# Patient Record
Sex: Male | Born: 1955 | Race: White | Hispanic: No | Smoking: Former smoker
Health system: Southern US, Community
[De-identification: ages and names within clinical notes are randomized; demographics above are authoritative.]

## PROBLEM LIST (undated history)

## (undated) DIAGNOSIS — I2699 Other pulmonary embolism without acute cor pulmonale: Secondary | ICD-10-CM

## (undated) DIAGNOSIS — L539 Erythematous condition, unspecified: Secondary | ICD-10-CM

## (undated) DIAGNOSIS — I2510 Atherosclerotic heart disease of native coronary artery without angina pectoris: Secondary | ICD-10-CM

## (undated) DIAGNOSIS — J4530 Mild persistent asthma, uncomplicated: Secondary | ICD-10-CM

## (undated) DIAGNOSIS — I10 Essential (primary) hypertension: Secondary | ICD-10-CM

## (undated) DIAGNOSIS — I872 Venous insufficiency (chronic) (peripheral): Secondary | ICD-10-CM

## (undated) DIAGNOSIS — L0390 Cellulitis, unspecified: Secondary | ICD-10-CM

## (undated) DIAGNOSIS — E790 Hyperuricemia without signs of inflammatory arthritis and tophaceous disease: Secondary | ICD-10-CM

## (undated) DIAGNOSIS — F320 Major depressive disorder, single episode, mild: Secondary | ICD-10-CM

## (undated) DIAGNOSIS — M109 Gout, unspecified: Secondary | ICD-10-CM

## (undated) DIAGNOSIS — M1990 Unspecified osteoarthritis, unspecified site: Secondary | ICD-10-CM

## (undated) DIAGNOSIS — J45901 Unspecified asthma with (acute) exacerbation: Secondary | ICD-10-CM

## (undated) DIAGNOSIS — M6740 Ganglion, unspecified site: Secondary | ICD-10-CM

## (undated) DIAGNOSIS — R7303 Prediabetes: Secondary | ICD-10-CM

## (undated) DIAGNOSIS — J329 Chronic sinusitis, unspecified: Secondary | ICD-10-CM

## (undated) DIAGNOSIS — M7989 Other specified soft tissue disorders: Secondary | ICD-10-CM

## (undated) DIAGNOSIS — F32A Depression, unspecified: Secondary | ICD-10-CM

## (undated) HISTORY — DX: Unspecified osteoarthritis, unspecified site: M19.90

## (undated) HISTORY — DX: Essential (primary) hypertension: I10

## (undated) HISTORY — DX: Other specified soft tissue disorders: M79.89

## (undated) HISTORY — DX: Atherosclerotic heart disease of native coronary artery without angina pectoris: I25.10

## (undated) HISTORY — DX: Prediabetes: R73.03

## (undated) HISTORY — DX: Mild persistent asthma, uncomplicated: J45.30

## (undated) HISTORY — DX: Unspecified asthma with (acute) exacerbation: J45.901

## (undated) HISTORY — DX: Chronic sinusitis, unspecified: J32.9

## (undated) HISTORY — DX: Other pulmonary embolism without acute cor pulmonale: I26.99

## (undated) HISTORY — DX: Morbid (severe) obesity due to excess calories: E66.01

## (undated) HISTORY — DX: Erythematous condition, unspecified: L53.9

## (undated) HISTORY — DX: Depression, unspecified: F32.A

## (undated) HISTORY — DX: Cellulitis, unspecified: L03.90

## (undated) HISTORY — PX: OTHER SURGICAL HISTORY: SHX169

## (undated) HISTORY — DX: Hyperuricemia without signs of inflammatory arthritis and tophaceous disease: E79.0

## (undated) HISTORY — DX: Venous insufficiency (chronic) (peripheral): I87.2

## (undated) HISTORY — DX: Ganglion, unspecified site: M67.40

## (undated) HISTORY — DX: Gout, unspecified: M10.9

## (undated) HISTORY — DX: Major depressive disorder, single episode, mild: F32.0

---

## 2006-07-06 ENCOUNTER — Encounter: Admission: RE | Admit: 2006-07-06 | Discharge: 2006-07-06 | Payer: Self-pay | Admitting: Otolaryngology

## 2008-05-04 HISTORY — PX: COLONOSCOPY: SHX174

## 2015-07-16 DIAGNOSIS — Z125 Encounter for screening for malignant neoplasm of prostate: Secondary | ICD-10-CM | POA: Diagnosis not present

## 2015-07-16 DIAGNOSIS — Z0000 Encounter for general adult medical examination without abnormal findings: Secondary | ICD-10-CM | POA: Diagnosis not present

## 2015-07-16 DIAGNOSIS — Z13220 Encounter for screening for lipoid disorders: Secondary | ICD-10-CM | POA: Diagnosis not present

## 2015-07-16 DIAGNOSIS — E790 Hyperuricemia without signs of inflammatory arthritis and tophaceous disease: Secondary | ICD-10-CM | POA: Diagnosis not present

## 2015-07-16 DIAGNOSIS — Z23 Encounter for immunization: Secondary | ICD-10-CM | POA: Diagnosis not present

## 2016-03-17 DIAGNOSIS — J329 Chronic sinusitis, unspecified: Secondary | ICD-10-CM | POA: Diagnosis not present

## 2016-04-26 ENCOUNTER — Ambulatory Visit: Payer: Self-pay | Admitting: Allergy & Immunology

## 2016-05-05 DIAGNOSIS — R0981 Nasal congestion: Secondary | ICD-10-CM | POA: Diagnosis not present

## 2016-05-22 ENCOUNTER — Ambulatory Visit: Payer: Self-pay | Admitting: Allergy and Immunology

## 2016-05-22 DIAGNOSIS — J301 Allergic rhinitis due to pollen: Secondary | ICD-10-CM | POA: Diagnosis not present

## 2016-05-22 DIAGNOSIS — J3089 Other allergic rhinitis: Secondary | ICD-10-CM | POA: Diagnosis not present

## 2017-05-18 DIAGNOSIS — J4531 Mild persistent asthma with (acute) exacerbation: Secondary | ICD-10-CM | POA: Diagnosis not present

## 2017-05-18 DIAGNOSIS — J301 Allergic rhinitis due to pollen: Secondary | ICD-10-CM | POA: Diagnosis not present

## 2017-06-05 DIAGNOSIS — J4531 Mild persistent asthma with (acute) exacerbation: Secondary | ICD-10-CM | POA: Diagnosis not present

## 2017-06-05 DIAGNOSIS — J301 Allergic rhinitis due to pollen: Secondary | ICD-10-CM | POA: Diagnosis not present

## 2017-08-24 DIAGNOSIS — J320 Chronic maxillary sinusitis: Secondary | ICD-10-CM | POA: Diagnosis not present

## 2017-08-31 ENCOUNTER — Other Ambulatory Visit: Payer: Self-pay | Admitting: Family Medicine

## 2017-08-31 DIAGNOSIS — J320 Chronic maxillary sinusitis: Secondary | ICD-10-CM

## 2017-09-25 ENCOUNTER — Other Ambulatory Visit: Payer: Self-pay

## 2017-10-01 ENCOUNTER — Other Ambulatory Visit: Payer: Self-pay

## 2017-10-10 ENCOUNTER — Other Ambulatory Visit: Payer: Self-pay

## 2017-10-23 ENCOUNTER — Other Ambulatory Visit: Payer: Self-pay

## 2017-11-06 ENCOUNTER — Other Ambulatory Visit: Payer: Self-pay

## 2017-11-20 ENCOUNTER — Other Ambulatory Visit: Payer: Self-pay

## 2017-11-21 ENCOUNTER — Other Ambulatory Visit: Payer: Self-pay

## 2018-02-08 DIAGNOSIS — R509 Fever, unspecified: Secondary | ICD-10-CM | POA: Diagnosis not present

## 2018-02-08 DIAGNOSIS — J320 Chronic maxillary sinusitis: Secondary | ICD-10-CM | POA: Diagnosis not present

## 2018-02-13 ENCOUNTER — Other Ambulatory Visit: Payer: Self-pay

## 2018-06-21 DIAGNOSIS — J4531 Mild persistent asthma with (acute) exacerbation: Secondary | ICD-10-CM | POA: Diagnosis not present

## 2018-06-21 DIAGNOSIS — J302 Other seasonal allergic rhinitis: Secondary | ICD-10-CM | POA: Diagnosis not present

## 2018-07-04 DIAGNOSIS — M109 Gout, unspecified: Secondary | ICD-10-CM | POA: Diagnosis not present

## 2018-07-16 DIAGNOSIS — M109 Gout, unspecified: Secondary | ICD-10-CM | POA: Diagnosis not present

## 2018-07-17 DIAGNOSIS — M109 Gout, unspecified: Secondary | ICD-10-CM | POA: Diagnosis not present

## 2018-12-23 DIAGNOSIS — J302 Other seasonal allergic rhinitis: Secondary | ICD-10-CM | POA: Diagnosis not present

## 2019-05-04 ENCOUNTER — Ambulatory Visit: Payer: Self-pay

## 2019-05-11 ENCOUNTER — Ambulatory Visit: Payer: Self-pay

## 2019-05-31 ENCOUNTER — Ambulatory Visit: Payer: Self-pay | Attending: Internal Medicine

## 2019-06-05 ENCOUNTER — Ambulatory Visit: Payer: Self-pay

## 2019-06-05 DIAGNOSIS — J019 Acute sinusitis, unspecified: Secondary | ICD-10-CM | POA: Diagnosis not present

## 2019-06-05 DIAGNOSIS — J302 Other seasonal allergic rhinitis: Secondary | ICD-10-CM | POA: Diagnosis not present

## 2019-06-09 DIAGNOSIS — R1010 Upper abdominal pain, unspecified: Secondary | ICD-10-CM | POA: Diagnosis not present

## 2019-06-09 DIAGNOSIS — R739 Hyperglycemia, unspecified: Secondary | ICD-10-CM | POA: Diagnosis not present

## 2019-06-09 DIAGNOSIS — R140 Abdominal distension (gaseous): Secondary | ICD-10-CM | POA: Diagnosis not present

## 2019-06-09 DIAGNOSIS — R339 Retention of urine, unspecified: Secondary | ICD-10-CM | POA: Diagnosis not present

## 2019-06-10 DIAGNOSIS — R339 Retention of urine, unspecified: Secondary | ICD-10-CM | POA: Diagnosis not present

## 2019-06-12 ENCOUNTER — Ambulatory Visit: Payer: Self-pay

## 2019-06-12 ENCOUNTER — Other Ambulatory Visit (HOSPITAL_BASED_OUTPATIENT_CLINIC_OR_DEPARTMENT_OTHER): Payer: Self-pay | Admitting: Family Medicine

## 2019-06-12 DIAGNOSIS — R140 Abdominal distension (gaseous): Secondary | ICD-10-CM

## 2019-06-12 DIAGNOSIS — R1010 Upper abdominal pain, unspecified: Secondary | ICD-10-CM

## 2019-06-12 DIAGNOSIS — R339 Retention of urine, unspecified: Secondary | ICD-10-CM

## 2019-06-17 ENCOUNTER — Ambulatory Visit: Payer: Federal, State, Local not specified - PPO | Attending: Internal Medicine

## 2019-06-17 DIAGNOSIS — Z23 Encounter for immunization: Secondary | ICD-10-CM

## 2019-06-17 NOTE — Progress Notes (Signed)
   Covid-19 Vaccination Clinic  Name:  DAXTON NYDAM    MRN: [id number] DOB: 1955/06/07  06/17/2019  Mr. Wrede was observed post Covid-19 immunization for 30 minutes based on pre-vaccination screening without incident. He was provided with Vaccine Information Sheet and instruction to access the V-Safe system.   Mr. Roedl was instructed to call 911 with any severe reactions post vaccine: [name] Kitchen Difficulty breathing  . Swelling of face and throat  . A fast heartbeat  . A bad rash all over body  . Dizziness and weakness   Immunizations Administered    Name Date Dose VIS Date Route   Pfizer COVID-19 Vaccine 06/17/2019 10:02 AM 0.3 mL 02/14/2019 Intramuscular   Manufacturer: [employer], [employer]   Lot: [id number]   NDC: [address]-3

## 2019-06-18 ENCOUNTER — Ambulatory Visit (HOSPITAL_BASED_OUTPATIENT_CLINIC_OR_DEPARTMENT_OTHER): Payer: Federal, State, Local not specified - PPO

## 2019-06-25 ENCOUNTER — Ambulatory Visit (HOSPITAL_BASED_OUTPATIENT_CLINIC_OR_DEPARTMENT_OTHER): Admission: RE | Admit: 2019-06-25 | Payer: Federal, State, Local not specified - PPO | Source: Ambulatory Visit

## 2019-07-08 ENCOUNTER — Ambulatory Visit: Payer: Federal, State, Local not specified - PPO

## 2019-07-15 ENCOUNTER — Ambulatory Visit: Payer: Federal, State, Local not specified - PPO

## 2019-08-05 ENCOUNTER — Ambulatory Visit: Payer: Federal, State, Local not specified - PPO

## 2019-08-14 ENCOUNTER — Ambulatory Visit: Payer: Federal, State, Local not specified - PPO

## 2019-08-21 ENCOUNTER — Ambulatory Visit: Payer: Federal, State, Local not specified - PPO

## 2019-10-14 ENCOUNTER — Ambulatory Visit: Payer: Self-pay

## 2020-04-01 DIAGNOSIS — R0989 Other specified symptoms and signs involving the circulatory and respiratory systems: Secondary | ICD-10-CM | POA: Diagnosis not present

## 2020-04-01 DIAGNOSIS — Z20822 Contact with and (suspected) exposure to covid-19: Secondary | ICD-10-CM | POA: Diagnosis not present

## 2020-04-01 DIAGNOSIS — U071 COVID-19: Secondary | ICD-10-CM | POA: Diagnosis not present

## 2020-04-01 DIAGNOSIS — R7303 Prediabetes: Secondary | ICD-10-CM | POA: Diagnosis not present

## 2020-04-15 DIAGNOSIS — U071 COVID-19: Secondary | ICD-10-CM | POA: Diagnosis not present

## 2020-04-15 DIAGNOSIS — R7303 Prediabetes: Secondary | ICD-10-CM | POA: Diagnosis not present

## 2020-04-15 DIAGNOSIS — R0989 Other specified symptoms and signs involving the circulatory and respiratory systems: Secondary | ICD-10-CM | POA: Diagnosis not present

## 2020-04-15 DIAGNOSIS — R0981 Nasal congestion: Secondary | ICD-10-CM | POA: Diagnosis not present

## 2020-05-11 DIAGNOSIS — R0981 Nasal congestion: Secondary | ICD-10-CM | POA: Diagnosis not present

## 2020-06-08 DIAGNOSIS — J343 Hypertrophy of nasal turbinates: Secondary | ICD-10-CM | POA: Diagnosis not present

## 2020-06-08 DIAGNOSIS — J310 Chronic rhinitis: Secondary | ICD-10-CM | POA: Diagnosis not present

## 2020-06-08 DIAGNOSIS — J342 Deviated nasal septum: Secondary | ICD-10-CM | POA: Diagnosis not present

## 2020-06-09 ENCOUNTER — Other Ambulatory Visit: Payer: Self-pay | Admitting: Otolaryngology

## 2020-06-09 DIAGNOSIS — J329 Chronic sinusitis, unspecified: Secondary | ICD-10-CM

## 2020-06-19 DIAGNOSIS — L03116 Cellulitis of left lower limb: Secondary | ICD-10-CM | POA: Diagnosis not present

## 2020-06-25 ENCOUNTER — Other Ambulatory Visit: Payer: Self-pay

## 2020-06-25 ENCOUNTER — Ambulatory Visit
Admission: RE | Admit: 2020-06-25 | Discharge: 2020-06-25 | Disposition: A | Discharge status: Home / Self Care | Payer: Federal, State, Local not specified - PPO | Source: Ambulatory Visit | Attending: Otolaryngology | Admitting: Otolaryngology

## 2020-06-25 DIAGNOSIS — J3489 Other specified disorders of nose and nasal sinuses: Secondary | ICD-10-CM | POA: Diagnosis not present

## 2020-06-25 DIAGNOSIS — J323 Chronic sphenoidal sinusitis: Secondary | ICD-10-CM | POA: Diagnosis not present

## 2020-06-25 DIAGNOSIS — J329 Chronic sinusitis, unspecified: Secondary | ICD-10-CM

## 2020-06-25 DIAGNOSIS — H748X3 Other specified disorders of middle ear and mastoid, bilateral: Secondary | ICD-10-CM | POA: Diagnosis not present

## 2020-07-07 DIAGNOSIS — M7989 Other specified soft tissue disorders: Secondary | ICD-10-CM | POA: Diagnosis not present

## 2020-07-07 DIAGNOSIS — Z131 Encounter for screening for diabetes mellitus: Secondary | ICD-10-CM | POA: Diagnosis not present

## 2020-07-08 DIAGNOSIS — J324 Chronic pansinusitis: Secondary | ICD-10-CM | POA: Diagnosis not present

## 2020-07-08 DIAGNOSIS — J343 Hypertrophy of nasal turbinates: Secondary | ICD-10-CM | POA: Diagnosis not present

## 2020-07-08 DIAGNOSIS — J342 Deviated nasal septum: Secondary | ICD-10-CM | POA: Diagnosis not present

## 2020-07-08 DIAGNOSIS — J338 Other polyp of sinus: Secondary | ICD-10-CM | POA: Diagnosis not present

## 2020-09-10 DIAGNOSIS — E6609 Other obesity due to excess calories: Secondary | ICD-10-CM | POA: Diagnosis not present

## 2020-09-10 DIAGNOSIS — M7989 Other specified soft tissue disorders: Secondary | ICD-10-CM | POA: Diagnosis not present

## 2020-09-10 DIAGNOSIS — E790 Hyperuricemia without signs of inflammatory arthritis and tophaceous disease: Secondary | ICD-10-CM | POA: Diagnosis not present

## 2020-09-10 DIAGNOSIS — R7303 Prediabetes: Secondary | ICD-10-CM | POA: Diagnosis not present

## 2020-09-10 DIAGNOSIS — Z0000 Encounter for general adult medical examination without abnormal findings: Secondary | ICD-10-CM | POA: Diagnosis not present

## 2020-09-10 DIAGNOSIS — Z125 Encounter for screening for malignant neoplasm of prostate: Secondary | ICD-10-CM | POA: Diagnosis not present

## 2020-10-01 DIAGNOSIS — E790 Hyperuricemia without signs of inflammatory arthritis and tophaceous disease: Secondary | ICD-10-CM | POA: Diagnosis not present

## 2020-10-01 DIAGNOSIS — E6609 Other obesity due to excess calories: Secondary | ICD-10-CM | POA: Diagnosis not present

## 2020-10-01 DIAGNOSIS — J4531 Mild persistent asthma with (acute) exacerbation: Secondary | ICD-10-CM | POA: Diagnosis not present

## 2020-10-01 DIAGNOSIS — I10 Essential (primary) hypertension: Secondary | ICD-10-CM | POA: Diagnosis not present

## 2020-10-11 ENCOUNTER — Other Ambulatory Visit: Payer: Self-pay

## 2020-10-11 DIAGNOSIS — U071 COVID-19: Secondary | ICD-10-CM | POA: Insufficient documentation

## 2020-10-11 DIAGNOSIS — R030 Elevated blood-pressure reading, without diagnosis of hypertension: Secondary | ICD-10-CM | POA: Insufficient documentation

## 2020-10-11 DIAGNOSIS — Z20822 Contact with and (suspected) exposure to covid-19: Secondary | ICD-10-CM | POA: Insufficient documentation

## 2020-10-11 DIAGNOSIS — Z8739 Personal history of other diseases of the musculoskeletal system and connective tissue: Secondary | ICD-10-CM | POA: Insufficient documentation

## 2020-10-11 DIAGNOSIS — Z860100 Personal history of colon polyps, unspecified: Secondary | ICD-10-CM | POA: Insufficient documentation

## 2020-10-11 DIAGNOSIS — I8393 Asymptomatic varicose veins of bilateral lower extremities: Secondary | ICD-10-CM

## 2020-10-11 DIAGNOSIS — J301 Allergic rhinitis due to pollen: Secondary | ICD-10-CM | POA: Insufficient documentation

## 2020-10-11 DIAGNOSIS — Z6841 Body Mass Index (BMI) 40.0 and over, adult: Secondary | ICD-10-CM | POA: Insufficient documentation

## 2020-10-11 DIAGNOSIS — R7301 Impaired fasting glucose: Secondary | ICD-10-CM | POA: Insufficient documentation

## 2020-10-11 DIAGNOSIS — R7303 Prediabetes: Secondary | ICD-10-CM | POA: Insufficient documentation

## 2020-10-11 DIAGNOSIS — J4531 Mild persistent asthma with (acute) exacerbation: Secondary | ICD-10-CM | POA: Insufficient documentation

## 2020-10-11 DIAGNOSIS — M109 Gout, unspecified: Secondary | ICD-10-CM | POA: Insufficient documentation

## 2020-10-13 ENCOUNTER — Other Ambulatory Visit: Payer: Self-pay

## 2020-10-25 NOTE — Progress Notes (Signed)
[facility] [place]  ASSESSMENT / PLAN: Matthew Oconnell is a 65 y.o. male with chronic venous insufficiency of the bilateral lower extremities.  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  Venous duplex shows left greater saphenous vein incompetence, but no saphenofemoral junction incompetence.  Do not think an endovenous intervention would help the patient.  Patient needs to lose weight.  He needs to be compliant with compression and elevation.  He is understanding and motivated.  Follow-up with me as needed.  CHIEF COMPLAINT: Slowly healing cellulitis and ulceration of bilateral lower extremities  HISTORY OF PRESENT ILLNESS: Matthew Oconnell is a 65 y.o. male for to the clinic for evaluation of chronic venous insufficiency.  The patient had a recent episode of cellulitis complicated by allergy to antibiotic which exacerbated the inflammation of his lower extremities.  He has had slow, but steady improvement in the skin infection.  It is now nearly resolved.  He has evidence of healed ulcers about the pretibial skin bilaterally.  He describes a fairly typical course of venous ulceration, reporting waxing and waning healing.  He has some edema.  He is morbidly obese.  He is understanding that he needs to lose weight and has made some progress.  Past Medical History:  Diagnosis Date   Asthmatic bronchitis with acute exacerbation    Cellulitis    Ganglion cyst    Of Left Hand   Gout    Hypertension    Hyperuricemia    Leg swelling    Mild depression (HCC)    Mild persistent asthma    Morbid obesity (HCC)    Osteoarthritis    Prediabetes    Recurrent sinusitis    Redness    Bilateral lower extremities   Stasis dermatitis of both legs    Venous insufficiency     Past Surgical History:  Procedure Laterality Date   COLONOSCOPY N/A 05/04/2008   ganglion cyst removed from L hand Left    Right hand repair for a compound  break Right     History reviewed. No pertinent family history.  Social History   Socioeconomic History   Marital status: Married    Spouse name: Not on file   Number of children: Not on file   Years of education: Not on file   Highest education level: Not on file  Occupational History   Not on file  Tobacco Use   Smoking status: Former    Types: Cigarettes   Smokeless tobacco: Never  Vaping Use   Vaping Use: Never used  Substance and Sexual Activity   Alcohol use: Not on file   Drug use: Not on file   Sexual activity: Not on file  Other Topics Concern   Not on file  Social History Narrative   Not on file   Social Determinants of Health   Financial Resource Strain: Not on file  Food Insecurity: Not on file  Transportation Needs: Not on file  Physical Activity: Not on file  Stress: Not on file  Social Connections: Not on file  Intimate Partner Violence: Not on file    Allergies  Allergen Reactions   Cefuroxime Rash   Sulfamethoxazole-Trimethoprim Rash    Current Outpatient Medications  Medication Sig Dispense Refill   fluticasone (FLONASE) 50 MCG/ACT nasal spray 2 spray in each nostril     fluticasone-salmeterol (ADVAIR) 250-50 MCG/ACT AEPB 2 (two) times daily.     furosemide (LASIX) 20 MG tablet  1 tablet     hydrochlorothiazide (MICROZIDE) 12.5 MG capsule Take 12.5 mg by mouth every morning.     ipratropium (ATROVENT) 0.03 % nasal spray 2 sprays in each nostril     ipratropium (ATROVENT) 0.06 % nasal spray SMARTSIG:1-2 Spray(s) Both Nares Twice Daily PRN     montelukast (SINGULAIR) 10 MG tablet Take 10 mg by mouth daily.     MULTIPLE VITAMIN PO 1 tablet     No current facility-administered medications for this visit.    REVIEW OF SYSTEMS:  [X]  denotes positive finding, [ ]  denotes negative finding Cardiac  Comments:  Chest pain or chest pressure:    Shortness of breath upon exertion:    Short of breath when lying flat:    Irregular heart rhythm:         Vascular    Pain in calf, thigh, or hip brought on by ambulation:    Pain in feet at night that wakes you up from your sleep:     Blood clot in your veins:    Leg swelling:         Pulmonary    Oxygen at home:    Productive cough:     Wheezing:         Neurologic    Sudden weakness in arms or legs:     Sudden numbness in arms or legs:     Sudden onset of difficulty speaking or slurred speech:    Temporary loss of vision in one eye:     Problems with dizziness:         Gastrointestinal    Blood in stool:     Vomited blood:         Genitourinary    Burning when urinating:     Blood in urine:        Psychiatric    Major depression:         Hematologic    Bleeding problems:    Problems with blood clotting too easily:        Skin    Rashes or ulcers:        Constitutional    Fever or chills:      PHYSICAL EXAM Vitals:   10/26/20 1408  BP: 139/81  Pulse: 90  Resp: 20  Temp: 97.9 F (36.6 C)  SpO2: 95%  Weight: (!) 368 lb (166.9 kg)  Height: 6' 3.5" (1.918 m)    Constitutional: morbidly obese. No distress. Appears well nourished.  Neurologic: CN intact. no focal findings. no sensory loss. Psychiatric:  Mood and affect symmetric and appropriate. Eyes:  No icterus. No conjunctival pallor. Ears, nose, throat:  mucous membranes moist. Midline trachea.  Cardiac: regular rate and rhythm.  Respiratory:  unlabored. Abdominal: morbid obesity.  Peripheral vascular: 2+ DP pulses. Venous stasis dermatitis. Healed ulcers about the pretibial skin Extremity: 1+ edema ankle to knee bilaterally. no cyanosis. no pallor.  Skin: no gangrene. no ulceration.  Lymphatic: no Stemmer's sign. no palpable lymphadenopathy.  PERTINENT LABORATORY AND RADIOLOGIC DATA Lower Venous Reflux Study   Patient Name:  Matthew Oconnell  Date of Exam:   10/26/2020  Medical Rec #: [id number]         Accession #:    [id number]  Date of Birth: 07/22/1955          Patient Gender: M  Patient Age:    [age] years  [facility]  Procedure:      VAS [place]  LOWER EXTREMITY VENOUS REFLUX  Referring Phys: Sherald Hess    ---------------------------------------------------------------------------  -----     Indications: Swelling.     Limitations: Poor ultrasound/tissue interface.     Comparison Study: None   Performing Technologist: Ethelle Lyon      Examination Guidelines: A complete evaluation includes B-mode imaging,  spectral  Doppler, color Doppler, and power Doppler as needed of all accessible  portions  of each vessel. Bilateral testing is considered an integral part of a  complete  examination. Limited examinations for reoccurring indications may be  performed  as noted. The reflux portion of the exam is performed with the patient in  reverse Trendelenburg.  Significant venous reflux is defined as >500 ms in the superficial venous  system, and >1 second in the deep venous system.      +--------------+---------+------+-----------+------------+--------+  LEFT          Reflux NoRefluxReflux TimeDiameter cmsComments                          Yes                                   +--------------+---------+------+-----------+------------+--------+  CFV           no                                              +--------------+---------+------+-----------+------------+--------+  FV mid        no                                              +--------------+---------+------+-----------+------------+--------+  Popliteal     no                                              +--------------+---------+------+-----------+------------+--------+  GSV at [facility]    no                            0.58              +--------------+---------+------+-----------+------------+--------+  GSV prox thigh          yes    >500 ms      0.53              +--------------+---------+------+-----------+------------+--------+   GSV mid thigh           yes    >500 ms      0.55              +--------------+---------+------+-----------+------------+--------+  GSV dist thigh          yes    >500 ms      0.50              +--------------+---------+------+-----------+------------+--------+  GSV at knee   no                            0.46              +--------------+---------+------+-----------+------------+--------+  GSV prox calf           yes    >500 ms      0.43              +--------------+---------+------+-----------+------------+--------+  SSV Pop Fossa no                            0.20              +--------------+---------+------+-----------+------------+--------+  SSV prox calf no                            0.27              +--------------+---------+------+-----------+------------+--------+  SSV mid calf  no                            0.29              +--------------+---------+------+-----------+------------+--------+          Summary:  Left:  - No evidence of deep vein thrombosis seen in the left lower extremity,  from the common femoral through the popliteal veins.  - No evidence of superficial venous thrombosis in the left lower  extremity.     - Venous reflux is noted in the left greater saphenous vein in the thigh.  - Venous reflux is noted in the left greater saphenous vein in the calf.     *See table(s) above for measurements and observations.   Rande Brunt. Lenell Antu, MD [facility] Phone Number: [phone] 10/26/2020 2:29 PM  Total time spent on preparing this encounter including chart review, data review, collecting history, examining the patient, coordinating care for this new patient, 45 minutes.  Portions of this report may have been transcribed using voice recognition software.  Every effort has been made to ensure accuracy; however, inadvertent computerized transcription errors may still be  present.

## 2020-10-26 ENCOUNTER — Ambulatory Visit (HOSPITAL_COMMUNITY)
Admission: RE | Admit: 2020-10-26 | Discharge: 2020-10-26 | Disposition: A | Discharge status: Home / Self Care | Payer: Federal, State, Local not specified - PPO | Source: Ambulatory Visit | Attending: Vascular Surgery | Admitting: Vascular Surgery

## 2020-10-26 ENCOUNTER — Ambulatory Visit: Payer: Federal, State, Local not specified - PPO | Admitting: Vascular Surgery

## 2020-10-26 ENCOUNTER — Other Ambulatory Visit: Payer: Self-pay

## 2020-10-26 ENCOUNTER — Encounter: Payer: Self-pay | Admitting: Vascular Surgery

## 2020-10-26 VITALS — BP 139/81 | HR 90 | Temp 97.9°F | Resp 20 | Ht 75.5 in | Wt 368.0 lb

## 2020-10-26 DIAGNOSIS — I872 Venous insufficiency (chronic) (peripheral): Secondary | ICD-10-CM | POA: Diagnosis not present

## 2020-10-26 DIAGNOSIS — I8393 Asymptomatic varicose veins of bilateral lower extremities: Secondary | ICD-10-CM | POA: Diagnosis not present

## 2020-11-25 DIAGNOSIS — I872 Venous insufficiency (chronic) (peripheral): Secondary | ICD-10-CM | POA: Diagnosis not present

## 2020-11-25 DIAGNOSIS — I10 Essential (primary) hypertension: Secondary | ICD-10-CM | POA: Diagnosis not present

## 2020-11-25 DIAGNOSIS — R030 Elevated blood-pressure reading, without diagnosis of hypertension: Secondary | ICD-10-CM | POA: Diagnosis not present

## 2020-11-25 DIAGNOSIS — R0989 Other specified symptoms and signs involving the circulatory and respiratory systems: Secondary | ICD-10-CM | POA: Diagnosis not present

## 2021-01-27 ENCOUNTER — Encounter (HOSPITAL_COMMUNITY): Payer: Self-pay | Admitting: *Deleted

## 2021-01-27 ENCOUNTER — Other Ambulatory Visit: Payer: Self-pay

## 2021-01-27 ENCOUNTER — Inpatient Hospital Stay (HOSPITAL_COMMUNITY)
Admission: EM | Admit: 2021-01-27 | Discharge: 2021-01-31 | DRG: 871 | Disposition: A | Discharge status: Home / Self Care | Payer: Medicare Other | Attending: Internal Medicine | Admitting: Internal Medicine

## 2021-01-27 ENCOUNTER — Emergency Department (HOSPITAL_COMMUNITY): Payer: Medicare Other

## 2021-01-27 DIAGNOSIS — Z87891 Personal history of nicotine dependence: Secondary | ICD-10-CM

## 2021-01-27 DIAGNOSIS — R739 Hyperglycemia, unspecified: Secondary | ICD-10-CM | POA: Diagnosis present

## 2021-01-27 DIAGNOSIS — E871 Hypo-osmolality and hyponatremia: Secondary | ICD-10-CM | POA: Diagnosis present

## 2021-01-27 DIAGNOSIS — J441 Chronic obstructive pulmonary disease with (acute) exacerbation: Secondary | ICD-10-CM | POA: Diagnosis present

## 2021-01-27 DIAGNOSIS — Z6841 Body Mass Index (BMI) 40.0 and over, adult: Secondary | ICD-10-CM | POA: Diagnosis not present

## 2021-01-27 DIAGNOSIS — Z79899 Other long term (current) drug therapy: Secondary | ICD-10-CM

## 2021-01-27 DIAGNOSIS — Z20822 Contact with and (suspected) exposure to covid-19: Secondary | ICD-10-CM | POA: Diagnosis not present

## 2021-01-27 DIAGNOSIS — I517 Cardiomegaly: Secondary | ICD-10-CM | POA: Diagnosis not present

## 2021-01-27 DIAGNOSIS — R7303 Prediabetes: Secondary | ICD-10-CM

## 2021-01-27 DIAGNOSIS — M109 Gout, unspecified: Secondary | ICD-10-CM | POA: Diagnosis present

## 2021-01-27 DIAGNOSIS — Z882 Allergy status to sulfonamides status: Secondary | ICD-10-CM | POA: Diagnosis not present

## 2021-01-27 DIAGNOSIS — A419 Sepsis, unspecified organism: Principal | ICD-10-CM | POA: Diagnosis present

## 2021-01-27 DIAGNOSIS — J9601 Acute respiratory failure with hypoxia: Secondary | ICD-10-CM | POA: Diagnosis present

## 2021-01-27 DIAGNOSIS — I2699 Other pulmonary embolism without acute cor pulmonale: Secondary | ICD-10-CM | POA: Diagnosis not present

## 2021-01-27 DIAGNOSIS — R0602 Shortness of breath: Secondary | ICD-10-CM | POA: Diagnosis not present

## 2021-01-27 DIAGNOSIS — I872 Venous insufficiency (chronic) (peripheral): Secondary | ICD-10-CM | POA: Diagnosis present

## 2021-01-27 DIAGNOSIS — Z28311 Partially vaccinated for covid-19: Secondary | ICD-10-CM | POA: Diagnosis not present

## 2021-01-27 DIAGNOSIS — J189 Pneumonia, unspecified organism: Secondary | ICD-10-CM | POA: Diagnosis not present

## 2021-01-27 DIAGNOSIS — Z881 Allergy status to other antibiotic agents status: Secondary | ICD-10-CM | POA: Diagnosis not present

## 2021-01-27 DIAGNOSIS — I10 Essential (primary) hypertension: Secondary | ICD-10-CM | POA: Diagnosis not present

## 2021-01-27 DIAGNOSIS — R6520 Severe sepsis without septic shock: Secondary | ICD-10-CM | POA: Diagnosis not present

## 2021-01-27 DIAGNOSIS — J440 Chronic obstructive pulmonary disease with acute lower respiratory infection: Secondary | ICD-10-CM | POA: Diagnosis present

## 2021-01-27 DIAGNOSIS — J4531 Mild persistent asthma with (acute) exacerbation: Secondary | ICD-10-CM | POA: Diagnosis present

## 2021-01-27 DIAGNOSIS — J45901 Unspecified asthma with (acute) exacerbation: Secondary | ICD-10-CM | POA: Diagnosis not present

## 2021-01-27 DIAGNOSIS — R000 Tachycardia, unspecified: Secondary | ICD-10-CM | POA: Diagnosis not present

## 2021-01-27 DIAGNOSIS — R7301 Impaired fasting glucose: Secondary | ICD-10-CM

## 2021-01-27 DIAGNOSIS — Z7951 Long term (current) use of inhaled steroids: Secondary | ICD-10-CM | POA: Diagnosis not present

## 2021-01-27 DIAGNOSIS — Z8249 Family history of ischemic heart disease and other diseases of the circulatory system: Secondary | ICD-10-CM

## 2021-01-27 DIAGNOSIS — Z23 Encounter for immunization: Secondary | ICD-10-CM

## 2021-01-27 LAB — CBC WITH DIFFERENTIAL/PLATELET
Abs Immature Granulocytes: 0.22 10*3/uL — ABNORMAL HIGH (ref 0.00–0.07)
Basophils Absolute: 0.1 10*3/uL (ref 0.0–0.1)
Basophils Relative: 0 %
Eosinophils Absolute: 0 10*3/uL (ref 0.0–0.5)
Eosinophils Relative: 0 %
HCT: 42.4 % (ref 39.0–52.0)
Hemoglobin: 14.7 g/dL (ref 13.0–17.0)
Immature Granulocytes: 1 %
Lymphocytes Relative: 4 %
Lymphs Abs: 0.9 10*3/uL (ref 0.7–4.0)
MCH: 33.1 pg (ref 26.0–34.0)
MCHC: 34.7 g/dL (ref 30.0–36.0)
MCV: 95.5 fL (ref 80.0–100.0)
Monocytes Absolute: 2.1 10*3/uL — ABNORMAL HIGH (ref 0.1–1.0)
Monocytes Relative: 9 %
Neutro Abs: 19.9 10*3/uL — ABNORMAL HIGH (ref 1.7–7.7)
Neutrophils Relative %: 86 %
Platelets: 272 10*3/uL (ref 150–400)
RBC: 4.44 MIL/uL (ref 4.22–5.81)
RDW: 13.1 % (ref 11.5–15.5)
WBC: 23.2 10*3/uL — ABNORMAL HIGH (ref 4.0–10.5)
nRBC: 0 % (ref 0.0–0.2)

## 2021-01-27 LAB — LACTIC ACID, PLASMA
Lactic Acid, Venous: 1.5 mmol/L (ref 0.5–1.9)
Lactic Acid, Venous: 1.4 mmol/L (ref 0.5–1.9)

## 2021-01-27 LAB — COMPREHENSIVE METABOLIC PANEL
ALT: 43 U/L (ref 0–44)
AST: 49 U/L — ABNORMAL HIGH (ref 15–41)
Albumin: 2.8 g/dL — ABNORMAL LOW (ref 3.5–5.0)
Alkaline Phosphatase: 119 U/L (ref 38–126)
Anion gap: 10 (ref 5–15)
BUN: 16 mg/dL (ref 8–23)
CO2: 24 mmol/L (ref 22–32)
Calcium: 8.7 mg/dL — ABNORMAL LOW (ref 8.9–10.3)
Chloride: 96 mmol/L — ABNORMAL LOW (ref 98–111)
Creatinine, Ser: 1.3 mg/dL — ABNORMAL HIGH (ref 0.61–1.24)
GFR, Estimated: >60 mL/min (ref >60)
Glucose, Bld: 182 mg/dL — ABNORMAL HIGH (ref 70–99)
Potassium: 4 mmol/L (ref 3.5–5.1)
Sodium: 130 mmol/L — ABNORMAL LOW (ref 135–145)
Total Bilirubin: 1.5 mg/dL — ABNORMAL HIGH (ref 0.3–1.2)
Total Protein: 6.7 g/dL (ref 6.5–8.1)

## 2021-01-27 LAB — URINALYSIS, ROUTINE W REFLEX MICROSCOPIC
Bacteria, UA: NONE SEEN
Bilirubin Urine: NEGATIVE
Glucose, UA: NEGATIVE mg/dL
Ketones, ur: 5 mg/dL — AB
Leukocytes,Ua: NEGATIVE
Nitrite: NEGATIVE
Protein, ur: 100 mg/dL — AB
Specific Gravity, Urine: >1.046 — ABNORMAL HIGH (ref 1.005–1.030)
pH: 5 (ref 5.0–8.0)

## 2021-01-27 LAB — TRIGLYCERIDES: Triglycerides: 87 mg/dL (ref <150)

## 2021-01-27 LAB — C-REACTIVE PROTEIN: CRP: 37.9 mg/dL — ABNORMAL HIGH (ref <1.0)

## 2021-01-27 LAB — FIBRINOGEN: Fibrinogen: >800 mg/dL — ABNORMAL HIGH (ref 210–475)

## 2021-01-27 LAB — RESP PANEL BY RT-PCR (FLU A&B, COVID) ARPGX2
Influenza A by PCR: NEGATIVE
Influenza B by PCR: NEGATIVE
SARS Coronavirus 2 by RT PCR: NEGATIVE

## 2021-01-27 LAB — PROCALCITONIN: Procalcitonin: 2.15 ng/mL

## 2021-01-27 LAB — D-DIMER, QUANTITATIVE: D-Dimer, Quant: 2.39 ug/mL-FEU — ABNORMAL HIGH (ref 0.00–0.50)

## 2021-01-27 LAB — LACTATE DEHYDROGENASE: LDH: 228 U/L — ABNORMAL HIGH (ref 98–192)

## 2021-01-27 LAB — FERRITIN: Ferritin: 1204 ng/mL — ABNORMAL HIGH (ref 24–336)

## 2021-01-27 MED ORDER — LEVOFLOXACIN IN D5W 750 MG/150ML IV SOLN
750.0000 mg | Freq: Once | INTRAVENOUS | Status: AC
  Administered 2021-01-27: 750 mg via INTRAVENOUS
  Filled 2021-01-27: qty 150

## 2021-01-27 MED ORDER — ACETAMINOPHEN 325 MG PO TABS: 650.0000 mg | ORAL_TABLET | Freq: Four times a day (QID) | ORAL | Status: DC | PRN

## 2021-01-27 MED ORDER — METHYLPREDNISOLONE SODIUM SUCC 125 MG IJ SOLR
125.0000 mg | Freq: Once | INTRAMUSCULAR | Status: AC
  Administered 2021-01-27: 125 mg via INTRAVENOUS
  Filled 2021-01-27: qty 2

## 2021-01-27 MED ORDER — HEPARIN (PORCINE) 25000 UT/250ML-% IV SOLN
2600.0000 [IU]/h | INTRAVENOUS | Status: DC
  Administered 2021-01-27: 23:00:00 2150 [IU]/h via INTRAVENOUS
  Administered 2021-01-28: 2600 [IU]/h via INTRAVENOUS
  Filled 2021-01-27 (×2): qty 250

## 2021-01-27 MED ORDER — HEPARIN BOLUS VIA INFUSION
7000.0000 [IU] | Freq: Once | INTRAVENOUS | Status: AC
  Administered 2021-01-27: 7000 [IU] via INTRAVENOUS
  Filled 2021-01-27: qty 7000

## 2021-01-27 MED ORDER — SODIUM CHLORIDE 0.9 % IV BOLUS
500.0000 mL | Freq: Once | INTRAVENOUS | Status: AC
  Administered 2021-01-27: 500 mL via INTRAVENOUS

## 2021-01-27 MED ORDER — LEVOFLOXACIN IN D5W 750 MG/150ML IV SOLN: 750.0000 mg | INTRAVENOUS | Status: DC

## 2021-01-27 MED ORDER — SODIUM CHLORIDE 0.9 % IV BOLUS: 1000.0000 mL | Freq: Once | INTRAVENOUS | Status: DC

## 2021-01-27 MED ORDER — ALBUTEROL SULFATE HFA 108 (90 BASE) MCG/ACT IN AERS
4.0000 | INHALATION_SPRAY | Freq: Once | RESPIRATORY_TRACT | Status: AC
  Administered 2021-01-27: 4 via RESPIRATORY_TRACT
  Filled 2021-01-27: qty 6.7

## 2021-01-27 MED ORDER — ACETAMINOPHEN 650 MG RE SUPP: 650.0000 mg | Freq: Four times a day (QID) | RECTAL | Status: DC | PRN

## 2021-01-27 MED ORDER — IOHEXOL 350 MG/ML SOLN
100.0000 mL | Freq: Once | INTRAVENOUS | Status: AC | PRN
  Administered 2021-01-27: 100 mL via INTRAVENOUS

## 2021-01-27 NOTE — Progress Notes (Signed)
Pharmacy Antibiotic Note  TAJAI IHDE is a 65 y.o. male admitted on 01/27/2021 with  PE and CAP .  Pharmacy has been consulted for Levaquin dosing.  Plan: Levaquin 750mg  IV Q24H.  Height: 6' 3.5" (191.8 cm) Weight: (!) 163.3 kg (360 lb) IBW/kg (Calculated) : 85.65  Temp (24hrs), Avg:99.3 F (37.4 C), Min:98.9 F (37.2 C), Max:99.7 F (37.6 C)  Recent Labs  Lab 01/27/21 1820 01/27/21 1846 01/27/21 1924  WBC 23.2*  --   --   CREATININE 1.30*  --   --   LATICACIDVEN  --  1.5 1.4    Estimated Creatinine Clearance: 93.5 mL/min (A) (by C-G formula based on SCr of 1.3 mg/dL (H)).    Allergies  Allergen Reactions   Cefuroxime Rash   Sulfamethoxazole-Trimethoprim Rash    Thank you for allowing pharmacy to be a part of this patient's care.  01/29/21, PharmD, BCPS  01/27/2021 11:20 PM

## 2021-01-27 NOTE — ED Notes (Signed)
Patient transported to CT 

## 2021-01-27 NOTE — ED Triage Notes (Signed)
Pt states sob since yesterday.  Tested positive for Covid on Tuesday.  RR of 28 in triage.  Placed on 2L [place] for sats of 91%.  States wife just died of complications of Covid in early Nov.

## 2021-01-27 NOTE — ED Provider Notes (Signed)
[facility] EMERGENCY DEPARTMENT Provider Note   CSN: [id number] Arrival date & time: 01/27/21  1801     History Chief Complaint  Patient presents with   Shortness of Breath    Matthew Oconnell is a 65 y.o. male.  HPI 65 year old male presents with dyspnea. He's been feeling short of breath for 2 days. Overall symptoms started 4 days ago. Most recently had a fever yesterday.  He has been having a cough productive of white and green sputum.  He has not had any chest pain.  Son did a home COVID test on him 2 days ago that was positive.  He has had 1 COVID-vaccine so far but has not completed the series.  He denies any significant leg swelling.  He was placed on oxygen on arrival to the emergency department, is not normally on oxygen.  He does have a former history of smoking and states he has been diagnosed with asthma.  Past Medical History:  Diagnosis Date   Asthmatic bronchitis with acute exacerbation    Cellulitis    Ganglion cyst    Of Left Hand   Gout    Hypertension    Hyperuricemia    Leg swelling    Mild depression    Mild persistent asthma    Morbid obesity (HCC)    Osteoarthritis    Prediabetes    Recurrent sinusitis    Redness    Bilateral lower extremities   Stasis dermatitis of both legs    Venous insufficiency     Patient Active Problem List   Diagnosis Date Noted   CAP (community acquired pneumonia) 01/27/2021   Body mass index (BMI) 45.0-49.9, adult (HCC) 10/11/2020   Allergic rhinitis due to pollen 10/11/2020   COVID-19 virus infection 10/11/2020   Elevated blood-pressure reading without diagnosis of hypertension 10/11/2020   Exposure to confirmed case of COVID-19 10/11/2020   History of gout 10/11/2020   Gout 10/11/2020   Impaired fasting glucose 10/11/2020   Mild persistent asthma with (acute) exacerbation 10/11/2020   Personal history of colonic polyps 10/11/2020   Prediabetes 10/11/2020    Past Surgical History:   Procedure Laterality Date   COLONOSCOPY N/A 05/04/2008   ganglion cyst removed from L hand Left    Right hand repair for a compound break Right        No family history on file.  Social History   Tobacco Use   Smoking status: Former    Types: Cigarettes    Quit date: 2015    Years since quitting: 7.9   Smokeless tobacco: Never  Vaping Use   Vaping Use: Never used  Substance Use Topics   Alcohol use: Yes    Comment: occ   Drug use: Not Currently    Home Medications Prior to Admission medications   Medication Sig Start Date End Date Taking? Authorizing Provider  acetaminophen (TYLENOL) 500 MG tablet Take 1,000 mg by mouth every 6 (six) hours as needed for moderate pain, headache or fever.   Yes [provider]  fexofenadine (ALLEGRA) 180 MG tablet Take 180 mg by mouth daily as needed for allergies or rhinitis.   Yes [provider]  fluticasone (FLONASE) 50 MCG/ACT nasal spray 2 sprays daily as needed for allergies.   Yes [provider]  fluticasone-salmeterol (ADVAIR) 250-50 MCG/ACT AEPB 1 puff 2 (two) times daily. 06/27/20  Yes [provider]  furosemide (LASIX) 20 MG tablet Take 20 mg by mouth 2 (two)  times daily. 07/07/20  Yes [provider]  ipratropium (ATROVENT) 0.03 % nasal spray Place 2 sprays into both nostrils daily as needed for rhinitis.   Yes [provider]  lisinopril (ZESTRIL) 10 MG tablet Take 10 mg by mouth daily. 01/23/21  Yes [provider]  montelukast (SINGULAIR) 10 MG tablet Take 10 mg by mouth daily. 10/05/20  Yes [provider]  PE-Triprolidine-DM-GG-APAP Roylene Reason CNG/CGH/COLD/FLU DY/NT PO) Take 10 mLs by mouth 2 (two) times daily as needed (cold/cough).   Yes [provider]    Allergies    Cefuroxime and Sulfamethoxazole-trimethoprim  Review of Systems   Review of Systems  Constitutional:  Positive for fever.  Respiratory:  Positive for cough and shortness of  breath.   Cardiovascular:  Negative for chest pain and leg swelling.  All other systems reviewed and are negative.  Physical Exam Updated Vital Signs BP 122/61   Pulse (!) 117   Temp 99.7 F (37.6 C) (Oral)   Resp (!) 25   Ht 6' 3.5" (1.918 m)   Wt (!) 163.3 kg   SpO2 90%   BMI 44.40 kg/m   Physical Exam Vitals and nursing note reviewed.  Constitutional:      General: He is not in acute distress.    Appearance: He is well-developed. He is obese. He is not ill-appearing or diaphoretic.  HENT:     Head: Normocephalic and atraumatic.     Right Ear: External ear normal.     Left Ear: External ear normal.     Nose: Nose normal.  Eyes:     General:        Right eye: No discharge.        Left eye: No discharge.  Cardiovascular:     Rate and Rhythm: Regular rhythm. Tachycardia present.     Heart sounds: Normal heart sounds.  Pulmonary:     Effort: Pulmonary effort is normal. Tachypnea present. No accessory muscle usage or respiratory distress.     Breath sounds: Wheezing (diffuse, expiratory) present.  Abdominal:     Palpations: Abdomen is soft.     Tenderness: There is no abdominal tenderness.  Musculoskeletal:     Cervical back: Neck supple.  Skin:    General: Skin is warm and dry.  Neurological:     Mental Status: He is alert.  Psychiatric:        Mood and Affect: Mood is not anxious.    ED Results / Procedures / Treatments   Labs (all labs ordered are listed, but only abnormal results are displayed) Labs Reviewed  COMPREHENSIVE METABOLIC PANEL - Abnormal; Notable for the following components:      Result Value   Sodium 130 (*)    Chloride 96 (*)    Glucose, Bld 182 (*)    Creatinine, Ser 1.30 (*)    Calcium 8.7 (*)    Albumin 2.8 (*)    AST 49 (*)    Total Bilirubin 1.5 (*)    All other components within normal limits  CBC WITH DIFFERENTIAL/PLATELET - Abnormal; Notable for the following components:   WBC 23.2 (*)    Neutro Abs 19.9 (*)    Monocytes  Absolute 2.1 (*)    Abs Immature Granulocytes 0.22 (*)    All other components within normal limits  URINALYSIS, ROUTINE W REFLEX MICROSCOPIC - Abnormal; Notable for the following components:   Color, Urine AMBER (*)    APPearance HAZY (*)    Specific Gravity, Urine >1.046 (*)  Hgb urine dipstick SMALL (*)    Ketones, ur 5 (*)    Protein, ur 100 (*)    All other components within normal limits  D-DIMER, QUANTITATIVE - Abnormal; Notable for the following components:   D-Dimer, Quant 2.39 (*)    All other components within normal limits  LACTATE DEHYDROGENASE - Abnormal; Notable for the following components:   LDH 228 (*)    All other components within normal limits  FERRITIN - Abnormal; Notable for the following components:   Ferritin 1,204 (*)    All other components within normal limits  FIBRINOGEN - Abnormal; Notable for the following components:   Fibrinogen >800 (*)    All other components within normal limits  C-REACTIVE PROTEIN - Abnormal; Notable for the following components:   CRP 37.9 (*)    All other components within normal limits  RESP PANEL BY RT-PCR (FLU A&B, COVID) ARPGX2  CULTURE, BLOOD (ROUTINE X 2)  CULTURE, BLOOD (ROUTINE X 2) W REFLEX TO ID PANEL  LACTIC ACID, PLASMA  LACTIC ACID, PLASMA  TRIGLYCERIDES  PROCALCITONIN  CBC  HEPARIN LEVEL (UNFRACTIONATED)  HIV ANTIBODY (ROUTINE TESTING W REFLEX)  PHOSPHORUS  MAGNESIUM  MAGNESIUM  COMPREHENSIVE METABOLIC PANEL    EKG None  Radiology CT Angio Chest PE W and/or Wo Contrast  Addendum Date: 01/27/2021   ADDENDUM REPORT: 01/27/2021 21:43 ADDENDUM: Findings were reported to Dr. Criss Alvine at 9:41 p.m. Electronically Signed   By: Thornell Sartorius M.D.   On: 01/27/2021 21:43   Result Date: 01/27/2021 CLINICAL DATA:  Positive D-dimer, PE suspected, low/intermediate probability. EXAM: CT ANGIOGRAPHY CHEST WITH CONTRAST TECHNIQUE: Multidetector CT imaging of the chest was performed using the standard protocol  during bolus administration of intravenous contrast. Multiplanar CT image reconstructions and MIPs were obtained to evaluate the vascular anatomy. CONTRAST:  OMNIPAQUE IOHEXOL 350 MG/ML SOLN COMPARISON:  01/27/2021. FINDINGS: Cardiovascular: The heart is enlarged and there is no pericardial effusion. Coronary artery calcifications are noted. There is no evidence of aortic aneurysm. The pulmonary trunk is distended suggesting underlying pulmonary artery hypertension. No large saddle embolus is identified. A filling defect is noted in a left upper lobe lobar pulmonary artery. Evaluation of the segmental and subsegmental arteries is limited due to suboptimal opacification, mixing artifact and respiratory motion. There is dilatation of the right ventricle, which may be associated with right heart strain. Mediastinum/Nodes: Shotty lymph nodes are present in the mediastinum and hilar regions bilaterally. No axillary lymphadenopathy. The trachea and esophagus are within normal limits. Lungs/Pleura: Scattered opacities are noted in the lungs bilaterally and there is consolidation in the left lower lobe. No effusion or pneumothorax. Upper Abdomen: No acute abnormality. Musculoskeletal: Gynecomastia is noted. Degenerative changes are present in the thoracic spine. Review of the MIP images confirms the above findings. IMPRESSION: 1. Left upper lobe lobar pulmonary embolus. There is dilatation of the right ventricle which may be associated with underlying right heart strain. Evaluation of the distal segmental and subsegmental arteries is limited due to suboptimal opacification, mixing artifact, and respiratory motion. 2. Patchy opacities in the lungs bilaterally, which may represent infectious or inflammatory pneumonitis. 3. Cardiomegaly with coronary artery calcifications. There is dilatation of the pulmonary trunk suggesting underlying pulmonary artery hypertension. Electronically Signed: By: Thornell Sartorius M.D. On:  01/27/2021 21:38   DG Chest Portable 1 View  Result Date: 01/27/2021 CLINICAL DATA:  Short of breath, COVID-19 positive EXAM: PORTABLE CHEST 1 VIEW COMPARISON:  07/27/2011 FINDINGS: Single frontal view of the chest demonstrates  a stable cardiac silhouette. There is basilar predominant interstitial and ground-glass opacity, which could reflect atypical pneumonia given history of COVID 19 positive. Edema could give a similar appearance. No effusion or pneumothorax. No acute bony abnormalities. IMPRESSION: 1. Basilar predominant interstitial and ground-glass opacities, which could reflect COVID-19 pneumonia given clinical presentation. Electronically Signed   By: Randa Ngo M.D.   On: 01/27/2021 18:39    Procedures .Critical Care Performed by: Sherwood Gambler, MD Authorized by: Sherwood Gambler, MD   Critical care provider statement:    Critical care time (minutes):  35   Critical care time was exclusive of:  Separately billable procedures and treating other patients   Critical care was necessary to treat or prevent imminent or life-threatening deterioration of the following conditions:  Respiratory failure and circulatory failure   Critical care was time spent personally by me on the following activities:  Development of treatment plan with patient or surrogate, discussions with consultants, evaluation of patient's response to treatment, examination of patient, ordering and review of laboratory studies, ordering and review of radiographic studies, ordering and performing treatments and interventions, pulse oximetry, re-evaluation of patient's condition and review of old charts   Medications Ordered in ED Medications  heparin ADULT infusion 100 units/mL (25000 units/247mL) (2,150 Units/hr Intravenous New Bag/Given 01/27/21 2249)  acetaminophen (TYLENOL) tablet 650 mg (has no administration in time range)    Or  acetaminophen (TYLENOL) suppository 650 mg (has no administration in time range)   levofloxacin (LEVAQUIN) IVPB 750 mg (has no administration in time range)  albuterol (VENTOLIN HFA) 108 (90 Base) MCG/ACT inhaler 4 puff (4 puffs Inhalation Given 01/27/21 1930)  methylPREDNISolone sodium succinate (SOLU-MEDROL) 125 mg/2 mL injection 125 mg (125 mg Intravenous Given 01/27/21 2012)  sodium chloride 0.9 % bolus 500 mL (0 mLs Intravenous Stopped 01/27/21 2021)  levofloxacin (LEVAQUIN) IVPB 750 mg (0 mg Intravenous Stopped 01/27/21 2257)  iohexol (OMNIPAQUE) 350 MG/ML injection 100 mL (100 mLs Intravenous Contrast Given 01/27/21 2117)  sodium chloride 0.9 % bolus 500 mL (0 mLs Intravenous Stopped 01/27/21 2305)  heparin bolus via infusion 7,000 Units (7,000 Units Intravenous Bolus from Bag 01/27/21 2249)    ED Course  I have reviewed the triage vital signs and the nursing notes.  Pertinent labs & imaging results that were available during my care of the patient were reviewed by me and considered in my medical decision making (see chart for details).    MDM Rules/Calculators/A&P                           At first the presumption was that the patient had COVID pneumonia given his home positive COVID test.  However, his COVID test here is negative.  Thus he was treated with IV antibiotics for pneumonia given the elevated WBC and presentation.  COVID labs that have been sent as are significantly elevated with inflammatory markers.  However there is also an elevated D-dimer and given fairly benign chest x-ray for his symptoms a CT was obtained.  This shows some pneumonitis/pneumonia but also a lobar PE.  Unclear if his RV dilation is strain versus chronic.  He will be started on IV heparin in addition to the antibiotics.  He is only requiring 2 L of oxygen at this time.  He will need admission. Final Clinical Impression(s) / ED Diagnoses Final diagnoses:  Acute respiratory failure with hypoxia ([place])  Community acquired pneumonia, unspecified laterality  Acute pulmonary embolism,  unspecified  pulmonary embolism type, unspecified whether acute cor pulmonale present [facility])    Rx / DC Orders ED Discharge Orders     None        Sherwood Gambler, MD 01/27/21 2349

## 2021-01-27 NOTE — Progress Notes (Signed)
ANTICOAGULATION CONSULT NOTE - Initial Consult  Pharmacy Consult for heparin Indication: pulmonary embolus  Allergies  Allergen Reactions   Cefuroxime Rash   Sulfamethoxazole-Trimethoprim Rash    Patient Measurements: Height: 6' 3.5" (191.8 cm) Weight: (!) 163.3 kg (360 lb) IBW/kg (Calculated) : 85.65 Heparin Dosing Weight: 123.9 kg  Vital Signs: Temp: 98.9 F (37.2 C) (11/24 1807) BP: 122/61 (11/24 2127) Pulse Rate: 117 (11/24 2127)  Labs: Recent Labs    01/27/21 1820  HGB 14.7  HCT 42.4  PLT 272  CREATININE 1.30*    Estimated Creatinine Clearance: 93.5 mL/min (A) (by C-G formula based on SCr of 1.3 mg/dL (H)).   Medical History: Past Medical History:  Diagnosis Date   Asthmatic bronchitis with acute exacerbation    Cellulitis    Ganglion cyst    Of Left Hand   Gout    Hypertension    Hyperuricemia    Leg swelling    Mild depression    Mild persistent asthma    Morbid obesity (HCC)    Osteoarthritis    Prediabetes    Recurrent sinusitis    Redness    Bilateral lower extremities   Stasis dermatitis of both legs    Venous insufficiency     Medications: see MAR  Assessment: 65 yo M with acute PE w/ RHS. CBC ok, no AC PTA.   Goal of Therapy:  Heparin level 0.3-0.7 units/ml Monitor platelets by anticoagulation protocol: Yes   Plan:  Give 7000 units bolus x 1 Start heparin infusion at 2150 units/hr Check anti-Xa level in 6 hours and daily while on heparin Continue to monitor H&H and platelets F/u plan for PO AC long term  Loleta Dicker, PharmD, [facility] Emergency Medicine Clinical Pharmacist ED RPh Phone: [phone] Main RX: [phone]

## 2021-01-27 NOTE — ED Notes (Signed)
MD at bedside. 

## 2021-01-27 NOTE — H&P (Signed)
History and Physical    PLEASE NOTE THAT DRAGON DICTATION SOFTWARE WAS USED IN THE CONSTRUCTION OF THIS NOTE.   PAT SIRES BWL:[id number] DOB: 1955/10/16 DOA: 01/27/2021  PCP: Pcp, No (will need to further assess) Patient coming from: home   I have personally briefly reviewed patient's old medical records in [place]  Chief Complaint: Shortness of breath  HPI: Matthew Oconnell is a 65 y.o. male with medical history significant for mild persistent asthma, hypertension, chronic venous insufficiency, who is admitted to [facility] on 01/27/2021 with severe sepsis due to community-acquired pneumonia after presenting from home to [facility] ED complaining of shortness of breath.   The patient reports 5 days progressive shortness of breath associated with new onset productive cough, subjective fever.  Denies any associated chest pain, palpitations, diaphoresis, dizziness, presyncope, or syncope.  No wheezing or hemoptysis.  He reports chronic edema in the bilateral lower extremities in the setting of a underlying diagnosis of chronic venous insufficiency.  Denies any recent worsening in the degree and distribution of edema in his bilateral lower EXTR.  No new calf tenderness or lower extremity erythema.  No recent orthopnea, PND.  States that subjective fevers have been associated with chills, full body rigors, or generalized myalgias.  He conveys a history of mild persistent asthma, but denies any chronic baseline supplemental oxygen requirements.   Denies any prior history of acute DVT or PE.  States that he drove 8-10 straight hours without getting out of the car approximately when driving down from the state of [place].  Denies any recent periods of prolonged admission dilatory activity.  No recent trauma or surgical procedures.  No known underlying malignancy.  Family history of DVT or PE.  Denies any recent rash, neck stiffness, dysuria, gross materia, or change in urinary  urgency/frequency.  No abdominal discomfort.     ED Course:  Vital signs in the ED were notable for the following: Temperature max 99.7; initial heart rate 121, which decreased to 100 following interval initiation of IV fluids; blood pressure 110/55 -129/80; respiratory rate 20-27; initial oxygen saturation 88% on room air, which improved to 92% on 2 L nasal cannula.  Labs were notable for the following: CMP notable for the following: Sodium 130, with no prior serum sodium data point available for point comparison, chloride 96, creatinine 1.30, liver enzymes within normal limits.  Urinalysis notable for 6-10 white blood cells, no bacteria, leukocyte Estrace negative, nitrate negative, specific gravity greater than 1.046.  Initial lactate 1.5, with repeat trending down to 1.4.  CBC notable for white blood cell count 23,000 with 86% neutrophils.  Procalcitonin 2.15.  D-dimer 2.39.  COVID-19/influenza PCR checked in the emergency department today found to be negative.  Blood cultures x2 collected prior to initiation of IV antibiotics.  Imaging and additional notable ED work-up: EKG showed sinus tachycardia with right renal branch block and heart rate 120, no evidence of T wave or ST changes, including no evidence of ST elevation.  CTA chest showed evidence of acute pulmonary embolism in the left upper lobe, with dilation of the right ventricle potentially associated with right heart strain, also showing evidence of patchy airspace opacities in bilateral lungs concerning for pneumonia.  While in the ED, the following were administered: Albuterol inhaler x2 puffs, heparin bolus followed by initiation of heparin drip, Solu-Medrol 125 mg IV x1, Levaquin, normal saline 500 cc bolus x2.  Subsequently, patient was admitted to the PCU for further evaluation and management  of presenting severe sepsis due to suspected community-acquired pneumonia as well as acute pulmonary embolism.      Review of Systems: As per  HPI otherwise 10 point review of systems negative.   Past Medical History:  Diagnosis Date   Asthmatic bronchitis with acute exacerbation    Cellulitis    Ganglion cyst    Of Left Hand   Gout    Hypertension    Hyperuricemia    Leg swelling    Mild depression    Mild persistent asthma    Morbid obesity (HCC)    Osteoarthritis    Prediabetes    Recurrent sinusitis    Redness    Bilateral lower extremities   Stasis dermatitis of both legs    Venous insufficiency     Past Surgical History:  Procedure Laterality Date   COLONOSCOPY N/A 05/04/2008   ganglion cyst removed from L hand Left    Right hand repair for a compound break Right     Social History:  reports that he quit smoking about 7 years ago. His smoking use included cigarettes. He has never used smokeless tobacco. He reports current alcohol use. He reports that he does not currently use drugs.   Allergies  Allergen Reactions   Cefuroxime Rash   Sulfamethoxazole-Trimethoprim Rash    Family history reviewed and not pertinent    Prior to Admission medications   Medication Sig Start Date End Date Taking? Authorizing Provider  fluticasone (FLONASE) 50 MCG/ACT nasal spray 2 spray in each nostril    [provider]  fluticasone-salmeterol (ADVAIR) 250-50 MCG/ACT AEPB 2 (two) times daily. 06/27/20   [provider]  furosemide (LASIX) 20 MG tablet 1 tablet 07/07/20   [provider]  hydrochlorothiazide (MICROZIDE) 12.5 MG capsule Take 12.5 mg by mouth every morning. 10/05/20   [provider]  ipratropium (ATROVENT) 0.03 % nasal spray 2 sprays in each nostril    [provider]  ipratropium (ATROVENT) 0.06 % nasal spray SMARTSIG:1-2 Spray(s) Both Nares Twice Daily PRN 09/21/20   [provider]  montelukast (SINGULAIR) 10 MG tablet Take 10 mg by mouth daily. 10/05/20   [provider]  MULTIPLE VITAMIN PO 1 tablet    [provider]     Objective     Physical Exam: Vitals:   01/27/21 1915 01/27/21 2045 01/27/21 2127 01/27/21 2216  BP: 108/84 (!) 85/73 122/61   Pulse: (!) 114 (!) 114 (!) 117   Resp: (!) 24 (!) 27 (!) 25   Temp:    99.7 F (37.6 C)  TempSrc:    Oral  SpO2: 91% (!) 88% 90%   Weight:      Height:        General: appears to be stated age; alert, oriented; mildly increased work of breathing noted Skin: warm, dry, no rash Head:  AT/[place] Mouth:  Oral mucosa membranes appear dry, normal dentition Neck: supple; trachea midline Heart:  RRR; did not appreciate any M/R/G Lungs: CTAB, did not appreciate any wheezes, rales, or rhonchi Abdomen: + BS; soft, ND, NT Vascular: 2+ pedal pulses b/l; 2+ radial pulses b/l Extremities: trace edema in b/l LE"s; no muscle wasting Neuro: strength and sensation intact in upper and lower extremities b/l     Labs on Admission: I have personally reviewed following labs and imaging studies  CBC: Recent Labs  Lab 01/27/21 1820  WBC 23.2*  NEUTROABS 19.9*  HGB 14.7  HCT 42.4  MCV 95.5  PLT 272  Basic Metabolic Panel: Recent Labs  Lab 01/27/21 1820  NA 130*  K 4.0  CL 96*  CO2 24  GLUCOSE 182*  BUN 16  CREATININE 1.30*  CALCIUM 8.7*   GFR: Estimated Creatinine Clearance: 93.5 mL/min (A) (by C-G formula based on SCr of 1.3 mg/dL (H)). Liver Function Tests: Recent Labs  Lab 01/27/21 1820  AST 49*  ALT 43  ALKPHOS 119  BILITOT 1.5*  PROT 6.7  ALBUMIN 2.8*   No results for input(s): LIPASE, AMYLASE in the last 168 hours. No results for input(s): AMMONIA in the last 168 hours. Coagulation Profile: No results for input(s): INR, PROTIME in the last 168 hours. Cardiac Enzymes: No results for input(s): CKTOTAL, CKMB, CKMBINDEX, TROPONINI in the last 168 hours. BNP (last 3 results) No results for input(s): PROBNP in the last 8760 hours. HbA1C: No results for input(s): HGBA1C in the last 72 hours. CBG: No results for input(s): GLUCAP in the last 168  hours. Lipid Profile: Recent Labs    01/27/21 1901  TRIG 87   Thyroid Function Tests: No results for input(s): TSH, T4TOTAL, FREET4, T3FREE, THYROIDAB in the last 72 hours. Anemia Panel: Recent Labs    01/27/21 1924  FERRITIN 1,204*   Urine analysis:    Component Value Date/Time   COLORURINE AMBER (A) 01/27/2021 2158   APPEARANCEUR HAZY (A) 01/27/2021 2158   LABSPEC >1.046 (H) 01/27/2021 2158   PHURINE 5.0 01/27/2021 2158   GLUCOSEU NEGATIVE 01/27/2021 2158   HGBUR SMALL (A) 01/27/2021 2158   BILIRUBINUR NEGATIVE 01/27/2021 2158   KETONESUR 5 (A) 01/27/2021 2158   PROTEINUR 100 (A) 01/27/2021 2158   NITRITE NEGATIVE 01/27/2021 2158   LEUKOCYTESUR NEGATIVE 01/27/2021 2158    Radiological Exams on Admission: CT Angio Chest PE W and/or Wo Contrast  Addendum Date: 01/27/2021   ADDENDUM REPORT: 01/27/2021 21:43 ADDENDUM: Findings were reported to Dr. Regenia Skeeter at 9:41 p.m. Electronically Signed   By: Brett Fairy M.D.   On: 01/27/2021 21:43   Result Date: 01/27/2021 CLINICAL DATA:  Positive D-dimer, PE suspected, low/intermediate probability. EXAM: CT ANGIOGRAPHY CHEST WITH CONTRAST TECHNIQUE: Multidetector CT imaging of the chest was performed using the standard protocol during bolus administration of intravenous contrast. Multiplanar CT image reconstructions and MIPs were obtained to evaluate the vascular anatomy. CONTRAST:  165mL OMNIPAQUE IOHEXOL 350 MG/ML SOLN COMPARISON:  01/27/2021. FINDINGS: Cardiovascular: The heart is enlarged and there is no pericardial effusion. Coronary artery calcifications are noted. There is no evidence of aortic aneurysm. The pulmonary trunk is distended suggesting underlying pulmonary artery hypertension. No large saddle embolus is identified. A filling defect is noted in a left upper lobe lobar pulmonary artery. Evaluation of the segmental and subsegmental arteries is limited due to suboptimal opacification, mixing artifact and respiratory motion.  There is dilatation of the right ventricle, which may be associated with right heart strain. Mediastinum/Nodes: Shotty lymph nodes are present in the mediastinum and hilar regions bilaterally. No axillary lymphadenopathy. The trachea and esophagus are within normal limits. Lungs/Pleura: Scattered opacities are noted in the lungs bilaterally and there is consolidation in the left lower lobe. No effusion or pneumothorax. Upper Abdomen: No acute abnormality. Musculoskeletal: Gynecomastia is noted. Degenerative changes are present in the thoracic spine. Review of the MIP images confirms the above findings. IMPRESSION: 1. Left upper lobe lobar pulmonary embolus. There is dilatation of the right ventricle which may be associated with underlying right heart strain. Evaluation of the distal segmental and subsegmental arteries is limited due to suboptimal  opacification, mixing artifact, and respiratory motion. 2. Patchy opacities in the lungs bilaterally, which may represent infectious or inflammatory pneumonitis. 3. Cardiomegaly with coronary artery calcifications. There is dilatation of the pulmonary trunk suggesting underlying pulmonary artery hypertension. Electronically Signed: By: Brett Fairy M.D. On: 01/27/2021 21:38   DG Chest Portable 1 View  Result Date: 01/27/2021 CLINICAL DATA:  Short of breath, COVID-19 positive EXAM: PORTABLE CHEST 1 VIEW COMPARISON:  07/27/2011 FINDINGS: Single frontal view of the chest demonstrates a stable cardiac silhouette. There is basilar predominant interstitial and ground-glass opacity, which could reflect atypical pneumonia given history of COVID 19 positive. Edema could give a similar appearance. No effusion or pneumothorax. No acute bony abnormalities. IMPRESSION: 1. Basilar predominant interstitial and ground-glass opacities, which could reflect COVID-19 pneumonia given clinical presentation. Electronically Signed   By: Randa Ngo M.D.   On: 01/27/2021 18:39     EKG:  Independently reviewed, with result as described above.    Assessment/Plan   Principal Problem:   Severe sepsis (HCC) Active Problems:   CAP (community acquired pneumonia)   SOB (shortness of breath)   Acute pulmonary embolism (HCC)   Acute asthma exacerbation   Hyponatremia   Hypertension      #) Severe sepsis due to community-acquired pneumonia: Diagnosis on the basis of 5 days of progressive shortness of breath, new onset productive cough, fever, elevated procalcitonin of 2.15, with CTA chest showing evidence of bilateral patchy airspace opacities concerning for pneumonia, with leukocytosis associated with neutrophilic predominance and COVID-19/influenza PCR that were found to be negative when checked in the ED today.   SIRS criteria met via leukocytosis , tachycardia, tachypnea. Of note, given the associated presence of suspected end organ damage in the form of concominant presenting acute hypoxic respiratory distress, criteria are met for pt's sepsis to be considered severe in nature.  Presenting lactate nonelevated at 1.5 .however, in the absence of lactic acid level that is greater than or equal to 4.0, and in the absence of any associated hypotension refractory to IVF's, there are no indications for administration of a 30 mL/kg IVF bolus at this time.  No e/o additional underlying infectious process at this time, including negative COVID-19/influenza PCR, as well as urinalysis that was not consistent with UTI.  Blood cultures x2 collected prior to initiation of Levaquin for CAP coverage in the setting of a reported allergy to cefuroxime.    Plan: CBC w/ diff in AM.  Follow-up results of blood cultures x2.  Continue Levaquin.  NSAIDs [place].  Flutter valve.  Check strep pneumonia urine antigen.  Check sputum culture.  Monitor on telemetry.  Monitor continuous pulse oximetry.      #) Acute pulmonary embolism: Presenting acute Evoxac respiratory distress, as well as elevated  D-dimer 2.39, CTA chest showed no evidence of acute pulmonary embolism in the left upper lobe, as well as dilation of the right ventricle potentially representing right heart strain.  Will check echocardiogram to further assess.  In the setting of acute hypoxic respiratory distress, tachycardia, with started on heparin drip in the ED.  Appears to be patient's first PE, and is suspected to be provoked given report of prolonged car ride of 8 to 10 hours without stopping 1 month ago.  No evidence of hypotension to warrant consideration for tPA administration at this time.  Plan: Heparin drip.  Check INR.  Repeat CBC in the morning.  Continuous pulse oximetry.  Monitor on telemetry.  Echocardiogram.      #)  Acute asthma exacerbation: In the setting of a documented history of mild persistent asthma for which the patient is on scheduled Advair as well as Singulair at home, suspect an element of acute asthma exacerbation as a consequence of both kidney acquired pneumonia as well as acute pulmonary embolism, as described above, and contributory to acute hypoxic respiratory distress.  Received a dose of Solu-Medrol 125 mg IV x1 in the ED.  Plan: Continue home Advair.  As needed albuterol nebulizer.  Add on serum magnesium and phosphorus levels.  Continue home Singulair.  Check VBG.  Monitor continuous pulse oximetry.  Further evaluation management suspected.  COVID-pneumonia as well as acute PE, as above.  27th milligrams p.o. daily.         #) acute hypoxic respiratory distress: In the context of no known baseline supplemental oxygen requirements, initial oxygen saturation was noted to be in the high 80s on room air, subsequently improving the will to the cannula, which, in the setting of worsening shortness of breath, meets criteria for acute hypoxic respiratory distress as opposed to failure.  Appears to be multifactorial in etiology, with contributions from suspected Communicare pneumonia, acute  pulmonary embolism, acute asthma exacerbation.  No clinical or radiographic evidence to suggest acutely decompensated heart failure at this time, although we will check BNP to further evaluate.  ACS is felt to be less likely.  In the absence of any recent chest pain, as well as the setting of presenting EKG showing no evidence of acute ischemic changes.  Chest x-ray showed no evidence of pneumothorax.  Plan: Further evaluation and management of acute asthma exacerbation, community-acquired pneumonia, and acute pulmonary embolism, including prednisone, Advair, Singulair, heparin drip, Levaquin, flutter valve, incentive serology.  Monitor continuous pulse oximetry.  Telemetry.  VBG.  BNP.  We will follow for results of echocardiogram as component of evaluation of presenting acute pulmonary embolism.       #) Acute hypo-osmolar hyponatremia: Presenting serum sodium of 130, with prior serum sodium one-point will comparison, including to establish chronicity of this finding. Suspect an element of hypovolumeia, with suspected contribution from dehydration in the setting of clinical evidence of such.   Differential also includes the possibility of a contribution from SIADH, particularly in the setting of multiple acute pulmonary processes, including suspected CAP, acute pulmonary embolism, and acute asthma exacerbation. in general, will provide gentle IV fluids to attend to suspected contribution from dehydration, while further evaluating for any additional contributing factors, including SIADH, as further detailed below. Will also TSH. No overt pharmacologic contributions. Of note, no evidence of associated acute focal neurologic deficits and no report of recent trauma.     Plan: monitor strict I's and O's and daily weights.  random urine sodium, urine osmolality.  Check serum osmolality to confirm suspected hypoosmolar etiology.  Repeat BMP in the morning. Check TSH.  Continue gentle IV fluids initiated in  the ED.        #) Essential Hypertension: documented history of such, with outpatient antihypertensive regimen including lisinopril.  Systolic blood pressures pressure in the ED today in the 110's - 120 's mmHg.    Plan: In the setting of presenting severe sepsis, will hold home Cipro for now.  Close monitoring of subsequent blood pressure via routine vital signs.      DVT prophylaxis: Heparin drip Code Status: Full code Family Communication: Case was discussed with the patient's son, who is present at bedside Disposition Plan: Per Rounding Team Consults called: none;  Admission status:  Inpatient ; pcu  Warrants inpatient status on basis of requires further evaluation and treatment of severe sepsis, including continuation of IV antibiotics as well as further hypoxic respiratory distress, including provision of supplemental oxygen in the absence of any baseline O2 requirements.    PLEASE NOTE THAT DRAGON DICTATION SOFTWARE WAS USED IN THE CONSTRUCTION OF THIS NOTE.   [place] DO [facility]  From [place]   01/27/2021, 10:55 PM

## 2021-01-28 ENCOUNTER — Inpatient Hospital Stay (HOSPITAL_COMMUNITY): Payer: Medicare Other

## 2021-01-28 ENCOUNTER — Encounter (HOSPITAL_COMMUNITY): Payer: Self-pay | Admitting: Internal Medicine

## 2021-01-28 ENCOUNTER — Other Ambulatory Visit (HOSPITAL_COMMUNITY): Payer: Self-pay

## 2021-01-28 DIAGNOSIS — R6520 Severe sepsis without septic shock: Secondary | ICD-10-CM | POA: Diagnosis present

## 2021-01-28 DIAGNOSIS — J45901 Unspecified asthma with (acute) exacerbation: Secondary | ICD-10-CM | POA: Diagnosis present

## 2021-01-28 DIAGNOSIS — I2699 Other pulmonary embolism without acute cor pulmonale: Secondary | ICD-10-CM | POA: Diagnosis not present

## 2021-01-28 DIAGNOSIS — R0602 Shortness of breath: Secondary | ICD-10-CM

## 2021-01-28 DIAGNOSIS — I10 Essential (primary) hypertension: Secondary | ICD-10-CM | POA: Diagnosis present

## 2021-01-28 DIAGNOSIS — A419 Sepsis, unspecified organism: Secondary | ICD-10-CM | POA: Diagnosis not present

## 2021-01-28 DIAGNOSIS — E871 Hypo-osmolality and hyponatremia: Secondary | ICD-10-CM | POA: Diagnosis present

## 2021-01-28 LAB — CBC
HCT: 41.5 % (ref 39.0–52.0)
Hemoglobin: 14 g/dL (ref 13.0–17.0)
MCH: 32.3 pg (ref 26.0–34.0)
MCHC: 33.7 g/dL (ref 30.0–36.0)
MCV: 95.6 fL (ref 80.0–100.0)
Platelets: 281 10*3/uL (ref 150–400)
RBC: 4.34 MIL/uL (ref 4.22–5.81)
RDW: 13.2 % (ref 11.5–15.5)
WBC: 23.1 10*3/uL — ABNORMAL HIGH (ref 4.0–10.5)
nRBC: 0 % (ref 0.0–0.2)

## 2021-01-28 LAB — COMPREHENSIVE METABOLIC PANEL
ALT: 47 U/L — ABNORMAL HIGH (ref 0–44)
AST: 48 U/L — ABNORMAL HIGH (ref 15–41)
Albumin: 2.6 g/dL — ABNORMAL LOW (ref 3.5–5.0)
Alkaline Phosphatase: 124 U/L (ref 38–126)
Anion gap: 10 (ref 5–15)
BUN: 15 mg/dL (ref 8–23)
CO2: 24 mmol/L (ref 22–32)
Calcium: 8.7 mg/dL — ABNORMAL LOW (ref 8.9–10.3)
Chloride: 98 mmol/L (ref 98–111)
Creatinine, Ser: 1.2 mg/dL (ref 0.61–1.24)
GFR, Estimated: >60 mL/min (ref >60)
Glucose, Bld: 255 mg/dL — ABNORMAL HIGH (ref 70–99)
Potassium: 4.3 mmol/L (ref 3.5–5.1)
Sodium: 132 mmol/L — ABNORMAL LOW (ref 135–145)
Total Bilirubin: 1 mg/dL (ref 0.3–1.2)
Total Protein: 7.1 g/dL (ref 6.5–8.1)

## 2021-01-28 LAB — ECHOCARDIOGRAM COMPLETE
Height: 75 in
S' Lateral: 3.7 cm
Weight: 5682.58 oz

## 2021-01-28 LAB — OSMOLALITY: Osmolality: 290 mOsm/kg (ref 275–295)

## 2021-01-28 LAB — BLOOD GAS, VENOUS
Acid-base deficit: 0.4 mmol/L (ref 0.0–2.0)
Bicarbonate: 24.5 mmol/L (ref 20.0–28.0)
Drawn by: 5790
FIO2: 36
O2 Saturation: 84.1 %
Patient temperature: 36.8
pCO2, Ven: 45.5 mmHg (ref 44.0–60.0)
pH, Ven: 7.35 (ref 7.250–7.430)
pO2, Ven: 48.2 mmHg — ABNORMAL HIGH (ref 32.0–45.0)

## 2021-01-28 LAB — HIV ANTIBODY (ROUTINE TESTING W REFLEX): HIV Screen 4th Generation wRfx: NONREACTIVE

## 2021-01-28 LAB — HEPARIN LEVEL (UNFRACTIONATED): Heparin Unfractionated: <0.1 IU/mL — ABNORMAL LOW (ref 0.30–0.70)

## 2021-01-28 LAB — PHOSPHORUS: Phosphorus: 2.9 mg/dL (ref 2.5–4.6)

## 2021-01-28 LAB — TSH: TSH: 0.542 u[IU]/mL (ref 0.350–4.500)

## 2021-01-28 LAB — MAGNESIUM
Magnesium: 2.3 mg/dL (ref 1.7–2.4)
Magnesium: 2.5 mg/dL — ABNORMAL HIGH (ref 1.7–2.4)

## 2021-01-28 LAB — BRAIN NATRIURETIC PEPTIDE: B Natriuretic Peptide: 58 pg/mL (ref 0.0–100.0)

## 2021-01-28 LAB — STREP PNEUMONIAE URINARY ANTIGEN: Strep Pneumo Urinary Antigen: NEGATIVE

## 2021-01-28 LAB — MRSA NEXT GEN BY PCR, NASAL: MRSA by PCR Next Gen: NOT DETECTED

## 2021-01-28 MED ORDER — ORAL CARE MOUTH RINSE
15.0000 mL | Freq: Two times a day (BID) | OROMUCOSAL | Status: DC
  Administered 2021-01-29 – 2021-01-31 (×5): 15 mL via OROMUCOSAL

## 2021-01-28 MED ORDER — PERFLUTREN LIPID MICROSPHERE
1.0000 mL | INTRAVENOUS | Status: AC | PRN
Start: 2021-01-28 — End: 2021-01-28
  Administered 2021-01-28: 3 mL via INTRAVENOUS
  Filled 2021-01-28: qty 10

## 2021-01-28 MED ORDER — RIVAROXABAN 15 MG PO TABS: 15.0000 mg | ORAL_TABLET | Freq: Two times a day (BID) | ORAL | Status: DC

## 2021-01-28 MED ORDER — RIVAROXABAN 20 MG PO TABS: 20.0000 mg | ORAL_TABLET | Freq: Every day | ORAL | Status: DC

## 2021-01-28 MED ORDER — PREDNISONE 20 MG PO TABS: 40.0000 mg | ORAL_TABLET | Freq: Every day | ORAL | Status: DC

## 2021-01-28 MED ORDER — RIVAROXABAN 15 MG PO TABS
15.0000 mg | ORAL_TABLET | Freq: Two times a day (BID) | ORAL | Status: DC
  Administered 2021-01-28 – 2021-01-31 (×7): 15 mg via ORAL
  Filled 2021-01-28 (×7): qty 1

## 2021-01-28 MED ORDER — METHYLPREDNISOLONE SODIUM SUCC 125 MG IJ SOLR
120.0000 mg | INTRAMUSCULAR | Status: DC
  Administered 2021-01-28: 120 mg via INTRAVENOUS
  Filled 2021-01-28: qty 2

## 2021-01-28 MED ORDER — HEPARIN BOLUS VIA INFUSION
3500.0000 [IU] | Freq: Once | INTRAVENOUS | Status: AC
  Administered 2021-01-28: 3500 [IU] via INTRAVENOUS
  Filled 2021-01-28: qty 3500

## 2021-01-28 MED ORDER — RIVAROXABAN (XARELTO) VTE STARTER PACK (15 & 20 MG)
ORAL_TABLET | ORAL | 2 refills | Status: DC
  Filled 2021-01-28: qty 51, 30d supply, fill #0
  Filled 2021-11-13: qty 51, 30d supply, fill #1

## 2021-01-28 MED ORDER — IPRATROPIUM-ALBUTEROL 0.5-2.5 (3) MG/3ML IN SOLN
3.0000 mL | Freq: Four times a day (QID) | RESPIRATORY_TRACT | Status: DC
  Administered 2021-01-28 – 2021-01-30 (×10): 3 mL via RESPIRATORY_TRACT
  Filled 2021-01-28 (×8): qty 3

## 2021-01-28 MED ORDER — MONTELUKAST SODIUM 10 MG PO TABS
10.0000 mg | ORAL_TABLET | Freq: Every day | ORAL | Status: DC
  Administered 2021-01-28 – 2021-01-31 (×4): 10 mg via ORAL
  Filled 2021-01-28 (×4): qty 1

## 2021-01-28 MED ORDER — MOMETASONE FURO-FORMOTEROL FUM 200-5 MCG/ACT IN AERO
2.0000 | INHALATION_SPRAY | Freq: Two times a day (BID) | RESPIRATORY_TRACT | Status: DC
  Administered 2021-01-28 – 2021-01-31 (×7): 2 via RESPIRATORY_TRACT
  Filled 2021-01-28: qty 8.8

## 2021-01-28 MED ORDER — ALBUTEROL SULFATE (2.5 MG/3ML) 0.083% IN NEBU: 2.5000 mg | INHALATION_SOLUTION | RESPIRATORY_TRACT | Status: DC | PRN

## 2021-01-28 MED ORDER — LEVOFLOXACIN 750 MG PO TABS
750.0000 mg | ORAL_TABLET | Freq: Every day | ORAL | Status: DC
  Administered 2021-01-28: 750 mg via ORAL
  Filled 2021-01-28: qty 1

## 2021-01-28 NOTE — Progress Notes (Signed)
PROGRESS NOTE    Matthew Oconnell  GDJ:[id number] DOB: Jul 19, 1955 DOA: 01/27/2021 PCP: Pcp, No  Brief Narrative:65/M with history of asthma, hypertension, chronic venous insufficiency, gout, leg swelling, obesity presented to the ED with progressive shortness of breath X 1 week. -Also reported cough, subjective fevers, congestion and wheezing.  Recent long drive to [place] 3 to 4 weeks prior -In the emergency room he was noted to be mildly hypoxic, tachypneic and tachycardic, CT chest was concerning for acute left upper lobe pulmonary embolism with dilation of right ventricle, and evidence of bilateral airspace opacities concerning for pneumonia   Assessment & Plan:   Principal Problem:   Severe sepsis -poa   CAP (community acquired pneumonia)   Acute asthma/COPD exacerbation -Continue Levaquin today, he is allergic to cephalosporins -Add duo nebs, IV steroids -Follow-up blood cultures -COVID PCR and influenza were negative  Acute pulmonary embolism -Likely triggered by morbid obesity, sedentary lifestyle and recent long drive to [place] -Remains on IV heparin, will transition to oral Xarelto today -Follow-up 2D echocardiogram  History of lower extremity edema -Suspect some component of CHF and/or venous insufficiency, has been taking diuretics lately -Follow-up echocardiogram, needs sleep study  Morbid obesity -BMI is 44  Hyperglycemia -Likely worsened by steroids, check hemoglobin A1c  DVT prophylaxis: Heparin/Xarelto Code Status: Full code Family Communication: Son at bedside Disposition Plan: Home pending clinical improvement Status is: Inpatient  Remains inpatient appropriate because: Severity of illness    Consultants:    Procedures:   Antimicrobials:    Subjective: -Breathing starting to improve  Objective: Vitals:   01/28/21 0045 01/28/21 0127 01/28/21 0257 01/28/21 0700  BP: 129/80 (!) 146/99 129/90 (!) 136/92  Pulse: (!) 105 (!) 113 100 97   Resp: (!) 28 (!) 22 20 (!) 31  Temp:  98.4 F (36.9 C) 98.3 F (36.8 C) 98.3 F (36.8 C)  TempSrc:  Oral Oral Oral  SpO2: 92% 92% 91% 92%  Weight:  (!) 161.1 kg    Height:  6\' 3"  (1.905 m)      Intake/Output Summary (Last 24 hours) at 01/28/2021 0955 Last data filed at 01/28/2021 0800 Gross per 24 hour  Intake 1564.33 ml  Output 350 ml  Net 1214.33 ml   Filed Weights   01/27/21 1809 01/28/21 0127  Weight: (!) 163.3 kg (!) 161.1 kg    Examination:  General exam: Obese pleasant male sitting up in bed, AAOx3, no distress HEENT: Neck obese unable to assess JVD CVS: S1-S2, regular rate rhythm Lungs: Scattered expiratory wheezes, basilar rhonchi Abdomen: Soft, nontender, bowel sounds present Extremities: Trace edema, chronic venous stasis Psychiatry: Judgement and insight appear normal. Mood & affect appropriate.     Data Reviewed:   CBC: Recent Labs  Lab 01/27/21 1820 01/28/21 0410  WBC 23.2* 23.1*  NEUTROABS 19.9*  --   HGB 14.7 14.0  HCT 42.4 41.5  MCV 95.5 95.6  PLT 272 281   Basic Metabolic Panel: Recent Labs  Lab 01/27/21 1820 01/28/21 0410  NA 130* 132*  K 4.0 4.3  CL 96* 98  CO2 24 24  GLUCOSE 182* 255*  BUN 16 15  CREATININE 1.30* 1.20  CALCIUM 8.7* 8.7*  MG  --  2.3  2.5*  PHOS  --  2.9   GFR: Estimated Creatinine Clearance: 99.9 mL/min (by C-G formula based on SCr of 1.2 mg/dL). Liver Function Tests: Recent Labs  Lab 01/27/21 1820 01/28/21 0410  AST 49* 48*  ALT 43 47*  ALKPHOS 119 124  BILITOT 1.5* 1.0  PROT 6.7 7.1  ALBUMIN 2.8* 2.6*   No results for input(s): LIPASE, AMYLASE in the last 168 hours. No results for input(s): AMMONIA in the last 168 hours. Coagulation Profile: No results for input(s): INR, PROTIME in the last 168 hours. Cardiac Enzymes: No results for input(s): CKTOTAL, CKMB, CKMBINDEX, TROPONINI in the last 168 hours. BNP (last 3 results) No results for input(s): PROBNP in the last 8760 hours. HbA1C: No  results for input(s): HGBA1C in the last 72 hours. CBG: No results for input(s): GLUCAP in the last 168 hours. Lipid Profile: Recent Labs    01/27/21 1901  TRIG 87   Thyroid Function Tests: Recent Labs    01/28/21 0648  TSH 0.542   Anemia Panel: Recent Labs    01/27/21 1924  FERRITIN 1,204*   Urine analysis:    Component Value Date/Time   COLORURINE AMBER (A) 01/27/2021 2158   APPEARANCEUR HAZY (A) 01/27/2021 2158   LABSPEC >1.046 (H) 01/27/2021 2158   PHURINE 5.0 01/27/2021 2158   GLUCOSEU NEGATIVE 01/27/2021 2158   HGBUR SMALL (A) 01/27/2021 2158   BILIRUBINUR NEGATIVE 01/27/2021 2158   KETONESUR 5 (A) 01/27/2021 2158   PROTEINUR 100 (A) 01/27/2021 2158   NITRITE NEGATIVE 01/27/2021 2158   LEUKOCYTESUR NEGATIVE 01/27/2021 2158   Sepsis Labs: @LABRCNTIP (procalcitonin:4,lacticidven:4)  ) Recent Results (from the past 240 hour(s))  Resp Panel by RT-PCR (Flu A&B, Covid) Nasopharyngeal Swab     Status: None   Collection Time: 01/27/21  7:01 PM   Specimen: Nasopharyngeal Swab; Nasopharyngeal(NP) swabs in vial transport medium  Result Value Ref Range Status   SARS Coronavirus 2 by RT PCR NEGATIVE NEGATIVE Final    Comment: (NOTE) SARS-CoV-2 target nucleic acids are NOT DETECTED.  The SARS-CoV-2 RNA is generally detectable in upper respiratory specimens during the acute phase of infection. The lowest concentration of SARS-CoV-2 viral copies this assay can detect is 138 copies/mL. A negative result does not preclude SARS-Cov-2 infection and should not be used as the sole basis for treatment or other patient management decisions. A negative result may occur with  improper specimen collection/handling, submission of specimen other than nasopharyngeal swab, presence of viral mutation(s) within the areas targeted by this assay, and inadequate number of viral copies(<138 copies/mL). A negative result must be combined with clinical observations, patient history, and  epidemiological information. The expected result is Negative.  Fact Sheet for Patients:  01/29/21  Fact Sheet for Healthcare Providers:  BloggerCourse.com  This test is no t yet approved or cleared by the [employer]ious[employer] FDA and  has been authorized for detection and/or diagnosis of SARS-CoV-2 by FDA under an Emergency Use Authorization (EUA). This EUA will remain  in effect (meaning this test can be used) for the duration of the COVID-19 declaration under Section 564(b)(1) of the Act, 21 U.S.C.section 360bbb-3(b)(1), unless the authorization is terminated  or revoked sooner.       Influenza A by PCR NEGATIVE NEGATIVE Final   Influenza B by PCR NEGATIVE NEGATIVE Final    Comment: (NOTE) The Xpert Xpress SARS-CoV-2/FLU/RSV plus assay is intended as an aid in the diagnosis of influenza from Nasopharyngeal swab specimens and should not be used as a sole basis for treatment. Nasal washings and aspirates are unacceptable for Xpert Xpress SARS-CoV-2/FLU/RSV testing.  Fact Sheet for Patients: [place]  Fact Sheet for Healthcare Providers: BloggerCourse.com  This test is not yet approved or cleared by the [employer] FDA and has been authorized for detection and/or  diagnosis of SARS-CoV-2 by FDA under an Emergency Use Authorization (EUA). This EUA will remain in effect (meaning this test can be used) for the duration of the COVID-19 declaration under Section 564(b)(1) of the Act, 21 U.S.C. section 360bbb-3(b)(1), unless the authorization is terminated or revoked.  Performed at [facility], [address] [place], [place] [address]   MRSA Next Gen by PCR, Nasal     Status: None   Collection Time: 01/28/21  2:12 AM   Specimen: Nasal Mucosa; Nasal Swab  Result Value Ref Range Status   MRSA by PCR Next Gen NOT DETECTED NOT DETECTED Final    Comment:  (NOTE) The GeneXpert MRSA Assay (FDA approved for NASAL specimens only), is one component of a comprehensive MRSA colonization surveillance program. It is not intended to diagnose MRSA infection nor to guide or monitor treatment for MRSA infections. Test performance is not FDA approved in patients less than 37 years old. Performed at [facility], [address], [place], [place] [address]          Radiology Studies: CT Angio Chest PE W and/or Wo Contrast  Addendum Date: 01/27/2021   ADDENDUM REPORT: 01/27/2021 21:43 ADDENDUM: Findings were reported to Dr. Criss Alvine at 9:41 p.m. Electronically Signed   By: Thornell Sartorius M.D.   On: 01/27/2021 21:43   Result Date: 01/27/2021 CLINICAL DATA:  Positive D-dimer, PE suspected, low/intermediate probability. EXAM: CT ANGIOGRAPHY CHEST WITH CONTRAST TECHNIQUE: Multidetector CT imaging of the chest was performed using the standard protocol during bolus administration of intravenous contrast. Multiplanar CT image reconstructions and MIPs were obtained to evaluate the vascular anatomy. CONTRAST:  OMNIPAQUE IOHEXOL 350 MG/ML SOLN COMPARISON:  01/27/2021. FINDINGS: Cardiovascular: The heart is enlarged and there is no pericardial effusion. Coronary artery calcifications are noted. There is no evidence of aortic aneurysm. The pulmonary trunk is distended suggesting underlying pulmonary artery hypertension. No large saddle embolus is identified. A filling defect is noted in a left upper lobe lobar pulmonary artery. Evaluation of the segmental and subsegmental arteries is limited due to suboptimal opacification, mixing artifact and respiratory motion. There is dilatation of the right ventricle, which may be associated with right heart strain. Mediastinum/Nodes: Shotty lymph nodes are present in the mediastinum and hilar regions bilaterally. No axillary lymphadenopathy. The trachea and esophagus are within normal limits. Lungs/Pleura: Scattered  opacities are noted in the lungs bilaterally and there is consolidation in the left lower lobe. No effusion or pneumothorax. Upper Abdomen: No acute abnormality. Musculoskeletal: Gynecomastia is noted. Degenerative changes are present in the thoracic spine. Review of the MIP images confirms the above findings. IMPRESSION: 1. Left upper lobe lobar pulmonary embolus. There is dilatation of the right ventricle which may be associated with underlying right heart strain. Evaluation of the distal segmental and subsegmental arteries is limited due to suboptimal opacification, mixing artifact, and respiratory motion. 2. Patchy opacities in the lungs bilaterally, which may represent infectious or inflammatory pneumonitis. 3. Cardiomegaly with coronary artery calcifications. There is dilatation of the pulmonary trunk suggesting underlying pulmonary artery hypertension. Electronically Signed: By: Thornell Sartorius M.D. On: 01/27/2021 21:38   DG Chest Portable 1 View  Result Date: 01/27/2021 CLINICAL DATA:  Short of breath, COVID-19 positive EXAM: PORTABLE CHEST 1 VIEW COMPARISON:  07/27/2011 FINDINGS: Single frontal view of the chest demonstrates a stable cardiac silhouette. There is basilar predominant interstitial and ground-glass opacity, which could reflect atypical pneumonia given history of COVID 19 positive. Edema could give a similar  appearance. No effusion or pneumothorax. No acute bony abnormalities. IMPRESSION: 1. Basilar predominant interstitial and ground-glass opacities, which could reflect COVID-19 pneumonia given clinical presentation. Electronically Signed   By: Sharlet Salina M.D.   On: 01/27/2021 18:39     Scheduled Meds:  ipratropium-albuterol  3 mL Nebulization QID   methylPREDNISolone (SOLU-MEDROL) injection  120 mg Intravenous Q24H   mometasone-formoterol  2 puff Inhalation BID   montelukast  10 mg Oral Daily   Continuous Infusions:  heparin 2,600 Units/hr (01/28/21 0800)   levofloxacin  (LEVAQUIN) IV       LOS: 1 day    Time spent: 25 min    Zannie Cove, MD [facility]   01/28/2021, 9:55 AM

## 2021-01-28 NOTE — Progress Notes (Signed)
ANTICOAGULATION CONSULT NOTE  Pharmacy Consult for heparin Indication: pulmonary embolus  Allergies  Allergen Reactions   Cefuroxime Rash   Sulfamethoxazole-Trimethoprim Rash    Patient Measurements: Height: 6\' 3"  (190.5 cm) Weight: (!) 161.1 kg (355 lb 2.6 oz) IBW/kg (Calculated) : 84.5 Heparin Dosing Weight: 123.9 kg  Vital Signs: Temp: 98.3 F (36.8 C) (11/25 0257) Temp Source: Oral (11/25 0257) BP: 129/90 (11/25 0257) Pulse Rate: 100 (11/25 0257)  Labs: Recent Labs    01/27/21 1820 01/28/21 0410  HGB 14.7 14.0  HCT 42.4 41.5  PLT 272 281  HEPARINUNFRC  --  <0.10*  CREATININE 1.30*  --      Estimated Creatinine Clearance: 92.2 mL/min (A) (by C-G formula based on SCr of 1.3 mg/dL (H)).  Assessment: 65 yo M with acute PE w/ RHS. CBC ok, no AC PTA.   Heparin level undetectable on gtt at 2150 units/hr. RN states that line fell out but that was after phlebotomy drew the lab (so lab should be accurate) - IV out for about 20 min. No bleeding reported per RN.  Goal of Therapy:  Heparin level 0.3-0.7 units/ml Monitor platelets by anticoagulation protocol: Yes   Plan:  Rebolus heparin 3500 units Increase heparin infusion to 2600 units/hr Will f/u 6 hr heparin level  2151, PharmD, BCPS Please see amion for complete clinical pharmacist phone list 01/28/2021 5:30 AM

## 2021-01-28 NOTE — Discharge Instructions (Signed)
Information on my medicine - XARELTO (rivaroxaban)  This medication education was reviewed with me or my healthcare representative as part of my discharge preparation.  The pharmacist that spoke with me during my hospital stay was:    WHY WAS XARELTO PRESCRIBED FOR YOU? Xarelto was prescribed to treat blood clots that may have been found in the veins of your legs (deep vein thrombosis) or in your lungs (pulmonary embolism) and to reduce the risk of them occurring again.  What do you need to know about Xarelto? The starting dose is one 15 mg tablet taken TWICE daily with food for the FIRST 21 DAYS then on (enter date)  02/18/21  the dose is changed to one 20 mg tablet taken ONCE A DAY with your evening meal.  DO NOT stop taking Xarelto without talking to the health care provider who prescribed the medication.  Refill your prescription for 20 mg tablets before you run out.  After discharge, you should have regular check-up appointments with your healthcare provider that is prescribing your Xarelto.  In the future your dose may need to be changed if your kidney function changes by a significant amount.  What do you do if you miss a dose? If you are taking Xarelto TWICE DAILY and you miss a dose, take it as soon as you remember. You may take two 15 mg tablets (total 30 mg) at the same time then resume your regularly scheduled 15 mg twice daily the next day.  If you are taking Xarelto ONCE DAILY and you miss a dose, take it as soon as you remember on the same day then continue your regularly scheduled once daily regimen the next day. Do not take two doses of Xarelto at the same time.   Important Safety Information Xarelto is a blood thinner medicine that can cause bleeding. You should call your healthcare provider right away if you experience any of the following: Bleeding from an injury or your nose that does not stop. Unusual colored urine (red or dark brown) or unusual colored stools  (red or black). Unusual bruising for unknown reasons. A serious fall or if you hit your head (even if there is no bleeding).  Some medicines may interact with Xarelto and might increase your risk of bleeding while on Xarelto. To help avoid this, consult your healthcare provider or pharmacist prior to using any new prescription or non-prescription medications, including herbals, vitamins, non-steroidal anti-inflammatory drugs (NSAIDs) and supplements.  This website has more information on Xarelto: VisitDestination.com.br.

## 2021-01-28 NOTE — Progress Notes (Signed)
  Echocardiogram 2D Echocardiogram has been performed.  Matthew Oconnell 01/28/2021, 3:08 PM

## 2021-01-28 NOTE — Progress Notes (Addendum)
ANTICOAGULATION CONSULT NOTE - Initial Consult  Pharmacy Consult for heparin>>Xarelto Indication: pulmonary embolus  Allergies  Allergen Reactions   Cefuroxime Rash   Sulfamethoxazole-Trimethoprim Rash    Patient Measurements: Height: 6\' 3"  (190.5 cm) Weight: (!) 161.1 kg (355 lb 2.6 oz) IBW/kg (Calculated) : 84.5 Heparin Dosing Weight: 123.9 kg  Vital Signs: Temp: 98.3 F (36.8 C) (11/25 0700) Temp Source: Oral (11/25 0700) BP: 136/92 (11/25 0700) Pulse Rate: 97 (11/25 0700)  Labs: Recent Labs    01/27/21 1820 01/28/21 0410  HGB 14.7 14.0  HCT 42.4 41.5  PLT 272 281  HEPARINUNFRC  --  <0.10*  CREATININE 1.30* 1.20     Estimated Creatinine Clearance: 99.9 mL/min (by C-G formula based on SCr of 1.2 mg/dL).   Medical History: Past Medical History:  Diagnosis Date   Asthmatic bronchitis with acute exacerbation    Cellulitis    Ganglion cyst    Of Left Hand   Gout    Hypertension    Hyperuricemia    Leg swelling    Mild depression    Mild persistent asthma    Morbid obesity (HCC)    Osteoarthritis    Prediabetes    Recurrent sinusitis    Redness    Bilateral lower extremities   Stasis dermatitis of both legs    Venous insufficiency     Medications: see MAR  Assessment: 65 yo M with acute PE w/ RHS. CBC ok, no AC PTA.   Transition to PO Xarelto today. Hgb/plt wnl  Goal of Therapy:  Monitor platelets by anticoagulation protocol: Yes   Plan:  Dc heparin Xarelto 15mg  PO BID with meals x65 days then 20mg  qday with supper Rx will monitor peripherally  76, PharmD, BCIDP, AAHIVP, CPP Infectious Disease Pharmacist 01/29/64 10:40 AM

## 2021-01-29 DIAGNOSIS — R6520 Severe sepsis without septic shock: Secondary | ICD-10-CM | POA: Diagnosis not present

## 2021-01-29 DIAGNOSIS — A419 Sepsis, unspecified organism: Secondary | ICD-10-CM | POA: Diagnosis not present

## 2021-01-29 LAB — GLUCOSE, CAPILLARY
Glucose-Capillary: 338 mg/dL — ABNORMAL HIGH (ref 70–99)
Glucose-Capillary: 202 mg/dL — ABNORMAL HIGH (ref 70–99)

## 2021-01-29 LAB — CBC
HCT: 39.1 % (ref 39.0–52.0)
Hemoglobin: 13.5 g/dL (ref 13.0–17.0)
MCH: 33 pg (ref 26.0–34.0)
MCHC: 34.5 g/dL (ref 30.0–36.0)
MCV: 95.6 fL (ref 80.0–100.0)
Platelets: 299 10*3/uL (ref 150–400)
RBC: 4.09 MIL/uL — ABNORMAL LOW (ref 4.22–5.81)
RDW: 13.2 % (ref 11.5–15.5)
WBC: 20 10*3/uL — ABNORMAL HIGH (ref 4.0–10.5)
nRBC: 0 % (ref 0.0–0.2)

## 2021-01-29 LAB — BASIC METABOLIC PANEL
Anion gap: 8 (ref 5–15)
BUN: 16 mg/dL (ref 8–23)
CO2: 25 mmol/L (ref 22–32)
Calcium: 8.9 mg/dL (ref 8.9–10.3)
Chloride: 98 mmol/L (ref 98–111)
Creatinine, Ser: 1.08 mg/dL (ref 0.61–1.24)
GFR, Estimated: >60 mL/min (ref >60)
Glucose, Bld: 304 mg/dL — ABNORMAL HIGH (ref 70–99)
Potassium: 4.2 mmol/L (ref 3.5–5.1)
Sodium: 131 mmol/L — ABNORMAL LOW (ref 135–145)

## 2021-01-29 LAB — PHOSPHORUS: Phosphorus: 2.7 mg/dL (ref 2.5–4.6)

## 2021-01-29 LAB — MAGNESIUM: Magnesium: 2.7 mg/dL — ABNORMAL HIGH (ref 1.7–2.4)

## 2021-01-29 MED ORDER — LORATADINE 10 MG PO TABS
10.0000 mg | ORAL_TABLET | Freq: Every day | ORAL | Status: DC
  Administered 2021-01-29 – 2021-01-31 (×3): 10 mg via ORAL
  Filled 2021-01-29 (×3): qty 1

## 2021-01-29 MED ORDER — LEVOFLOXACIN 750 MG PO TABS
750.0000 mg | ORAL_TABLET | Freq: Every day | ORAL | Status: DC
  Administered 2021-01-29 – 2021-01-30 (×2): 750 mg via ORAL
  Filled 2021-01-29 (×3): qty 1

## 2021-01-29 MED ORDER — PNEUMOCOCCAL VAC POLYVALENT 25 MCG/0.5ML IJ INJ
0.5000 mL | INJECTION | INTRAMUSCULAR | Status: DC
  Filled 2021-01-29: qty 0.5

## 2021-01-29 MED ORDER — FUROSEMIDE 10 MG/ML IJ SOLN
40.0000 mg | Freq: Once | INTRAMUSCULAR | Status: AC
  Administered 2021-01-29: 40 mg via INTRAVENOUS
  Filled 2021-01-29: qty 4

## 2021-01-29 MED ORDER — METHYLPREDNISOLONE SODIUM SUCC 125 MG IJ SOLR
80.0000 mg | INTRAMUSCULAR | Status: DC
  Administered 2021-01-29: 80 mg via INTRAVENOUS
  Filled 2021-01-29: qty 2

## 2021-01-29 MED ORDER — INSULIN ASPART 100 UNIT/ML IJ SOLN
0.0000 [IU] | Freq: Three times a day (TID) | INTRAMUSCULAR | Status: DC
  Administered 2021-01-29: 11 [IU] via SUBCUTANEOUS
  Administered 2021-01-30 (×2): 8 [IU] via SUBCUTANEOUS
  Administered 2021-01-30 – 2021-01-31 (×2): 5 [IU] via SUBCUTANEOUS
  Administered 2021-01-31: 12:00:00 15 [IU] via SUBCUTANEOUS

## 2021-01-29 NOTE — Progress Notes (Signed)
PROGRESS NOTE    Matthew Oconnell  PPJ:[id number] DOB: 1955/06/11 DOA: 01/27/2021 PCP: Pcp, No  Brief Narrative:65/M with history of asthma, hypertension, chronic venous insufficiency, gout, leg swelling, obesity presented to the ED with progressive shortness of breath X 1 week. -Also reported cough, subjective fevers, congestion and wheezing.  Recent long drive to [place] 3 to 4 weeks prior -In the emergency room he was noted to be mildly hypoxic, tachypneic and tachycardic, CT chest was concerning for acute left upper lobe pulmonary embolism with dilation of right ventricle, and evidence of bilateral airspace opacities concerning for pneumonia   Assessment & Plan:   Principal Problem:   Severe sepsis -poa   CAP (community acquired pneumonia)   Acute asthma/COPD exacerbation -Continue Levaquin today, he is allergic to cephalosporins -Continue duo nebs and IV steroids, transition to prednisone taper tomorrow -Blood cultures are negative -COVID PCR and influenza were negative  Acute pulmonary embolism -Likely triggered by morbid obesity, sedentary lifestyle and recent long drive to [place] -Treated with IV heparin initially, transitioned to oral Xarelto yesterday -2D echo noted some RV dilation and decreased RV function, likely secondary acute PE and undiagnosed OSA, will need sleep study as outpatient   History of lower extremity edema Venous insufficiency -No evidence of CHF on echo, has been taking Lasix twice daily for few months -needs sleep study -Lasix X1 today  Morbid obesity -BMI is 44  Hyperglycemia -Likely worsened by steroids, follow-up hemoglobin A1c  DVT prophylaxis: Xarelto Code Status: Full code Family Communication: Son at bedside yesterday Disposition Plan: Home pending clinical improvement Status is: Inpatient  Remains inpatient appropriate because: Severity of illness    Consultants:    Procedures:   Antimicrobials:     Subjective: -Breathing starting to improve  Objective: Vitals:   01/29/21 0745 01/29/21 0836 01/29/21 1110 01/29/21 1131  BP: (!) 142/80   (!) 113/95  Pulse: 90   95  Resp: 20   20  Temp: 98.6 F (37 C)   99 F (37.2 C)  TempSrc: Oral   Oral  SpO2: 91% 93% 90% 91%  Weight:      Height:        Intake/Output Summary (Last 24 hours) at 01/29/2021 1146 Last data filed at 01/29/2021 1131 Gross per 24 hour  Intake 1320 ml  Output 2225 ml  Net -905 ml   Filed Weights   01/27/21 1809 01/28/21 0127 01/29/21 0547  Weight: (!) 163.3 kg (!) 161.1 kg (!) 158.9 kg    Examination:  General exam: Obese pleasant male sitting up in bed, AAOx3, no distress HEENT: Neck obese unable to assess JVD CVS: S1-S2, regular rate rhythm Lungs: Scattered expiratory wheezes, basilar rhonchi Abdomen: Soft, nontender, bowel sounds present Extremities: Trace edema, chronic venous stasis Psychiatry: Judgement and insight appear normal. Mood & affect appropriate.     Data Reviewed:   CBC: Recent Labs  Lab 01/27/21 1820 01/28/21 0410 01/29/21 0111  WBC 23.2* 23.1* 20.0*  NEUTROABS 19.9*  --   --   HGB 14.7 14.0 13.5  HCT 42.4 41.5 39.1  MCV 95.5 95.6 95.6  PLT 272 281 299   Basic Metabolic Panel: Recent Labs  Lab 01/27/21 1820 01/28/21 0410 01/29/21 0111  NA 130* 132* 131*  K 4.0 4.3 4.2  CL 96* 98 98  CO2 24 24 25   GLUCOSE 182* 255* 304*  BUN 16 15 16   CREATININE 1.30* 1.20 1.08  CALCIUM 8.7* 8.7* 8.9  MG  --  2.3  2.5*  2.7*  PHOS  --  2.9 2.7   GFR: Estimated Creatinine Clearance: 110.2 mL/min (by C-G formula based on SCr of 1.08 mg/dL). Liver Function Tests: Recent Labs  Lab 01/27/21 1820 01/28/21 0410  AST 49* 48*  ALT 43 47*  ALKPHOS 119 124  BILITOT 1.5* 1.0  PROT 6.7 7.1  ALBUMIN 2.8* 2.6*   No results for input(s): LIPASE, AMYLASE in the last 168 hours. No results for input(s): AMMONIA in the last 168 hours. Coagulation Profile: No results for  input(s): INR, PROTIME in the last 168 hours. Cardiac Enzymes: No results for input(s): CKTOTAL, CKMB, CKMBINDEX, TROPONINI in the last 168 hours. BNP (last 3 results) No results for input(s): PROBNP in the last 8760 hours. HbA1C: No results for input(s): HGBA1C in the last 72 hours. CBG: No results for input(s): GLUCAP in the last 168 hours. Lipid Profile: Recent Labs    01/27/21 1901  TRIG 87   Thyroid Function Tests: Recent Labs    01/28/21 0648  TSH 0.542   Anemia Panel: Recent Labs    01/27/21 1924  FERRITIN 1,204*   Urine analysis:    Component Value Date/Time   COLORURINE AMBER (A) 01/27/2021 2158   APPEARANCEUR HAZY (A) 01/27/2021 2158   LABSPEC >1.046 (H) 01/27/2021 2158   PHURINE 5.0 01/27/2021 2158   GLUCOSEU NEGATIVE 01/27/2021 2158   HGBUR SMALL (A) 01/27/2021 2158   BILIRUBINUR NEGATIVE 01/27/2021 2158   KETONESUR 5 (A) 01/27/2021 2158   PROTEINUR 100 (A) 01/27/2021 2158   NITRITE NEGATIVE 01/27/2021 2158   LEUKOCYTESUR NEGATIVE 01/27/2021 2158   Sepsis Labs: @LABRCNTIP (procalcitonin:4,lacticidven:4)  ) Recent Results (from the past 240 hour(s))  Resp Panel by RT-PCR (Flu A&B, Covid) Nasopharyngeal Swab     Status: None   Collection Time: 01/27/21  7:01 PM   Specimen: Nasopharyngeal Swab; Nasopharyngeal(NP) swabs in vial transport medium  Result Value Ref Range Status   SARS Coronavirus 2 by RT PCR NEGATIVE NEGATIVE Final    Comment: (NOTE) SARS-CoV-2 target nucleic acids are NOT DETECTED.  The SARS-CoV-2 RNA is generally detectable in upper respiratory specimens during the acute phase of infection. The lowest concentration of SARS-CoV-2 viral copies this assay can detect is 138 copies/mL. A negative result does not preclude SARS-Cov-2 infection and should not be used as the sole basis for treatment or other patient management decisions. A negative result may occur with  improper specimen collection/handling, submission of specimen  other than nasopharyngeal swab, presence of viral mutation(s) within the areas targeted by this assay, and inadequate number of viral copies(<138 copies/mL). A negative result must be combined with clinical observations, patient history, and epidemiological information. The expected result is Negative.  Fact Sheet for Patients:  01/29/21  Fact Sheet for Healthcare Providers:  BloggerCourse.com  This test is no t yet approved or cleared by the [employer] FDA and  has been authorized for detection and/or diagnosis of SARS-CoV-2 by FDA under an Emergency Use Authorization (EUA). This EUA will remain  in effect (meaning this test can be used) for the duration of the COVID-19 declaration under Section 564(b)(1) of the Act, 21 U.S.C.section 360bbb-3(b)(1), unless the authorization is terminated  or revoked sooner.       Influenza A by PCR NEGATIVE NEGATIVE Final   Influenza B by PCR NEGATIVE NEGATIVE Final    Comment: (NOTE) The Xpert Xpress SARS-CoV-2/FLU/RSV plus assay is intended as an aid in the diagnosis of influenza from Nasopharyngeal swab specimens and should not be used as a  sole basis for treatment. Nasal washings and aspirates are unacceptable for Xpert Xpress SARS-CoV-2/FLU/RSV testing.  Fact Sheet for Patients: BloggerCourse.com  Fact Sheet for Healthcare Providers: SeriousBroker.it  This test is not yet approved or cleared by the [place] FDA and has been authorized for detection and/or diagnosis of SARS-CoV-2 by FDA under an Emergency Use Authorization (EUA). This EUA will remain in effect (meaning this test can be used) for the duration of the COVID-19 declaration under Section 564(b)(1) of the Act, 21 U.S.C. section 360bbb-3(b)(1), unless the authorization is terminated or revoked.  Performed at [facility], [address], [place],  [place] [address]   Blood Culture (routine x 2)     Status: None (Preliminary result)   Collection Time: 01/27/21  8:29 PM   Specimen: BLOOD RIGHT HAND  Result Value Ref Range Status   Specimen Description BLOOD RIGHT HAND  Final   Special Requests   Final    BOTTLES DRAWN AEROBIC AND ANAEROBIC Blood Culture adequate volume   Culture   Final    NO GROWTH 2 DAYS Performed at [facility], [address], [place], [place] [address]    Report Status PENDING  Incomplete  Culture, blood (Routine X 2) w Reflex to ID Panel     Status: None (Preliminary result)   Collection Time: 01/27/21  8:31 PM   Specimen: BLOOD LEFT HAND  Result Value Ref Range Status   Specimen Description BLOOD LEFT HAND  Final   Special Requests   Final    BOTTLES DRAWN AEROBIC AND ANAEROBIC Blood Culture adequate volume   Culture   Final    NO GROWTH 2 DAYS Performed at [facility], [address] [place], [place] [address]    Report Status PENDING  Incomplete  MRSA Next Gen by PCR, Nasal     Status: None   Collection Time: 01/28/21  2:12 AM   Specimen: Nasal Mucosa; Nasal Swab  Result Value Ref Range Status   MRSA by PCR Next Gen NOT DETECTED NOT DETECTED Final    Comment: (NOTE) The GeneXpert MRSA Assay (FDA approved for NASAL specimens only), is one component of a comprehensive MRSA colonization surveillance program. It is not intended to diagnose MRSA infection nor to guide or monitor treatment for MRSA infections. Test performance is not FDA approved in patients less than 38 years old. Performed at [facility], [address], [place], [place] [address]   Expectorated Sputum Assessment w Gram Stain, Rflx to Resp Cult     Status: None (Preliminary result)   Collection Time: 01/28/21  8:35 PM   Specimen: Expectorated Sputum  Result Value Ref Range Status   Specimen Description EXPECTORATED SPUTUM  Final   Special Requests NONE  Final   Sputum evaluation   Final    THIS SPECIMEN IS  ACCEPTABLE FOR SPUTUM CULTURE Performed at [facility], [address] [place], [place] [address]    Report Status PENDING  Incomplete  Culture, Respiratory w Gram Stain     Status: None (Preliminary result)   Collection Time: 01/28/21  8:35 PM  Result Value Ref Range Status   Specimen Description EXPECTORATED SPUTUM  Final   Special Requests NONE Reflexed from [id number]  Final   Gram Stain   Final    RARE SQUAMOUS EPITHELIAL CELLS PRESENT RARE WBC PRESENT, PREDOMINANTLY MONONUCLEAR FEW GRAM POSITIVE COCCI IN PAIRS FEW GRAM NEGATIVE COCCI RARE GRAM VARIABLE ROD    Culture  Final    TOO YOUNG TO READ Performed at [facility], [address], [place], [place] [address]    Report Status PENDING  Incomplete         Radiology Studies: CT Angio Chest PE W and/or Wo Contrast  Addendum Date: 01/27/2021   ADDENDUM REPORT: 01/27/2021 21:43 ADDENDUM: Findings were reported to Dr. Criss Alvine at 9:41 p.m. Electronically Signed   By: Thornell Sartorius M.D.   On: 01/27/2021 21:43   Result Date: 01/27/2021 CLINICAL DATA:  Positive D-dimer, PE suspected, low/intermediate probability. EXAM: CT ANGIOGRAPHY CHEST WITH CONTRAST TECHNIQUE: Multidetector CT imaging of the chest was performed using the standard protocol during bolus administration of intravenous contrast. Multiplanar CT image reconstructions and MIPs were obtained to evaluate the vascular anatomy. CONTRAST:  OMNIPAQUE IOHEXOL 350 MG/ML SOLN COMPARISON:  01/27/2021. FINDINGS: Cardiovascular: The heart is enlarged and there is no pericardial effusion. Coronary artery calcifications are noted. There is no evidence of aortic aneurysm. The pulmonary trunk is distended suggesting underlying pulmonary artery hypertension. No large saddle embolus is identified. A filling defect is noted in a left upper lobe lobar pulmonary artery. Evaluation of the segmental and subsegmental arteries is limited due to suboptimal opacification, mixing  artifact and respiratory motion. There is dilatation of the right ventricle, which may be associated with right heart strain. Mediastinum/Nodes: Shotty lymph nodes are present in the mediastinum and hilar regions bilaterally. No axillary lymphadenopathy. The trachea and esophagus are within normal limits. Lungs/Pleura: Scattered opacities are noted in the lungs bilaterally and there is consolidation in the left lower lobe. No effusion or pneumothorax. Upper Abdomen: No acute abnormality. Musculoskeletal: Gynecomastia is noted. Degenerative changes are present in the thoracic spine. Review of the MIP images confirms the above findings. IMPRESSION: 1. Left upper lobe lobar pulmonary embolus. There is dilatation of the right ventricle which may be associated with underlying right heart strain. Evaluation of the distal segmental and subsegmental arteries is limited due to suboptimal opacification, mixing artifact, and respiratory motion. 2. Patchy opacities in the lungs bilaterally, which may represent infectious or inflammatory pneumonitis. 3. Cardiomegaly with coronary artery calcifications. There is dilatation of the pulmonary trunk suggesting underlying pulmonary artery hypertension. Electronically Signed: By: Thornell Sartorius M.D. On: 01/27/2021 21:38   DG Chest Portable 1 View  Result Date: 01/27/2021 CLINICAL DATA:  Short of breath, COVID-19 positive EXAM: PORTABLE CHEST 1 VIEW COMPARISON:  07/27/2011 FINDINGS: Single frontal view of the chest demonstrates a stable cardiac silhouette. There is basilar predominant interstitial and ground-glass opacity, which could reflect atypical pneumonia given history of COVID 19 positive. Edema could give a similar appearance. No effusion or pneumothorax. No acute bony abnormalities. IMPRESSION: 1. Basilar predominant interstitial and ground-glass opacities, which could reflect COVID-19 pneumonia given clinical presentation. Electronically Signed   By: Sharlet Salina M.D.    On: 01/27/2021 18:39   ECHOCARDIOGRAM COMPLETE  Result Date: 01/28/2021    ECHOCARDIOGRAM REPORT   Patient Name:   Matthew Oconnell Date of Exam: 01/28/2021 Medical Rec #:  [id number]        Height:       75.0 in Accession #:    [id number]       Weight:       355.2 lb Date of Birth:  03-20-1955         BSA:          2.801 m Patient Age:    65 years         BP:  152/82 mmHg Patient Gender: M                HR:           96 bpm. Exam Location:  Inpatient Procedure: 2D Echo and Intracardiac Opacification Agent Indications:    pulmonary embolus  History:        Patient has no prior history of Echocardiogram examinations.                 Pneumonia. sepsis., Signs/Symptoms:Dyspnea; Risk                 Factors:Hypertension.  Sonographer:    Delcie Roch RDCS Referring Phys: [id number] JUSTIN B HOWERTER IMPRESSIONS  1. Left ventricular ejection fraction, by estimation, is 60 to 65%. The left ventricle has normal function. The left ventricle has no regional wall motion abnormalities. Left ventricular diastolic parameters were normal.  2. Right ventricular systolic function is moderately reduced. The right ventricular size is moderately enlarged.  3. Left atrial size was mildly dilated.  4. The mitral valve is normal in structure. No evidence of mitral valve regurgitation. No evidence of mitral stenosis.  5. The aortic valve is tricuspid. Aortic valve regurgitation is not visualized. No aortic stenosis is present.  6. Aortic dilatation noted. There is mild dilatation of the ascending aorta, measuring 38 mm.  7. The inferior vena cava is dilated in size with >50% respiratory variability, suggesting right atrial pressure of 8 mmHg. FINDINGS  Left Ventricle: Left ventricular ejection fraction, by estimation, is 60 to 65%. The left ventricle has normal function. The left ventricle has no regional wall motion abnormalities. Definity contrast agent was given IV to delineate the left ventricular  endocardial borders.  The left ventricular internal cavity size was normal in size. There is no left ventricular hypertrophy. Left ventricular diastolic parameters were normal. Right Ventricle: The right ventricular size is moderately enlarged. No increase in right ventricular wall thickness. Right ventricular systolic function is moderately reduced. Left Atrium: Left atrial size was mildly dilated. Right Atrium: Right atrial size was normal in size. Pericardium: There is no evidence of pericardial effusion. Mitral Valve: The mitral valve is normal in structure. There is mild thickening of the mitral valve leaflet(s). There is moderate calcification of the mitral valve leaflet(s). No evidence of mitral valve regurgitation. No evidence of mitral valve stenosis. Tricuspid Valve: The tricuspid valve is normal in structure. Tricuspid valve regurgitation is not demonstrated. No evidence of tricuspid stenosis. Aortic Valve: The aortic valve is tricuspid. Aortic valve regurgitation is not visualized. No aortic stenosis is present. Pulmonic Valve: The pulmonic valve was normal in structure. Pulmonic valve regurgitation is not visualized. No evidence of pulmonic stenosis. Aorta: The aortic root is normal in size and structure and aortic dilatation noted. There is mild dilatation of the ascending aorta, measuring 38 mm. Venous: The inferior vena cava is dilated in size with greater than 50% respiratory variability, suggesting right atrial pressure of 8 mmHg. IAS/Shunts: No atrial level shunt detected by color flow Doppler.  LEFT VENTRICLE PLAX 2D LVIDd:         5.30 cm   Diastology LVIDs:         3.70 cm   LV e' lateral: 12.00 cm/s LV PW:         1.20 cm LV IVS:        1.40 cm LVOT diam:     2.20 cm LV SV:         82 LV SV  Index:   29 LVOT Area:     3.80 cm  RIGHT VENTRICLE             IVC RV S prime:     17.10 cm/s  IVC diam: 2.20 cm TAPSE (M-mode): 3.1 cm LEFT ATRIUM              Index        RIGHT ATRIUM           Index LA diam:        4.30 cm   1.54 cm/m   RA Area:     21.40 cm LA Vol (A2C):   80.2 ml  28.63 ml/m  RA Volume:   62.60 ml  22.35 ml/m LA Vol (A4C):   102.0 ml 36.42 ml/m LA Biplane Vol: 99.2 ml  35.42 ml/m  AORTIC VALVE LVOT Vmax:   134.00 cm/s LVOT Vmean:  84.900 cm/s LVOT VTI:    0.217 m  AORTA Ao Root diam: 3.60 cm Ao Asc diam:  3.80 cm  SHUNTS Systemic VTI:  0.22 m Systemic Diam: 2.20 cm Charlton Haws MD Electronically signed by Charlton Haws MD Signature Date/Time: 01/28/2021/4:33:14 PM    Final      Scheduled Meds:  ipratropium-albuterol  3 mL Nebulization QID   levofloxacin  750 mg Oral q1800   loratadine  10 mg Oral Daily   mouth rinse  15 mL Mouth Rinse BID   methylPREDNISolone (SOLU-MEDROL) injection  80 mg Intravenous Q24H   mometasone-formoterol  2 puff Inhalation BID   montelukast  10 mg Oral Daily   [START ON 01/30/2021] pneumococcal 23 valent vaccine  0.5 mL Intramuscular Tomorrow-1000   rivaroxaban  15 mg Oral BID WC   Followed by   Melene Muller ON 02/18/2021] rivaroxaban  20 mg Oral Q supper   Continuous Infusions:     LOS: 2 days    Time spent: 25 min    Zannie Cove, MD [facility]   01/29/2021, 11:46 AM

## 2021-01-29 NOTE — Evaluation (Signed)
Physical Therapy Evaluation Patient Details Name: Matthew Oconnell MRN: [id number] DOB: 26-Jan-1956 Today's Date: 01/29/2021  History of Present Illness  Matthew Oconnell is a 65 y.o. male admitted 11/24 with severe sepsis due to community-acquired pneumonia after presenting from home to [facility] ED complaining of shortness of breath.  + for COPD exacerbation and acute PE. PMH: mild persistent asthma, hypertension, chronic venous insufficiency  Clinical Impression  Pt admitted with above diagnosis. Pt was able to ambulate in the room and went to bathroom as well with min guard to supervision.  Does desaturate on RA at rest and with activity. Pt is hopeful he will progress and not need the O2.  Will follow acutely.  Pt currently with functional limitations due to the deficits listed below (see PT Problem List). Pt will benefit from skilled PT to increase their independence and safety with mobility to allow discharge to the venue listed below.          Recommendations for follow up therapy are one component of a multi-disciplinary discharge planning process, led by the attending physician.  Recommendations may be updated based on patient status, additional functional criteria and insurance authorization.  Follow Up Recommendations No PT follow up    Assistance Recommended at Discharge PRN  Functional Status Assessment Patient has had a recent decline in their functional status and demonstrates the ability to make significant improvements in function in a reasonable and predictable amount of time.  Equipment Recommendations  None recommended by PT    Recommendations for Other Services       Precautions / Restrictions Precautions Precautions: Fall Restrictions Weight Bearing Restrictions: No      Mobility  Bed Mobility Overal bed mobility: Independent                  Transfers Overall transfer level: Independent                      Ambulation/Gait Ambulation/Gait  assistance: Supervision;Min guard Gait Distance (Feet): 150 Feet (50 feet x 3) Assistive device: None Gait Pattern/deviations: Step-through pattern;Decreased stride length;Wide base of support   Gait velocity interpretation: <1.8 ft/sec, indicate of risk for recurrent falls   General Gait Details: Pt steady with gait.  DOE 3/4 with minimal activity. Pt does desaturate as well on RA and needs 2L at rest and 3L with activity to keep sats >88%.  Stairs            Wheelchair Mobility    Modified Rankin (Stroke Patients Only)       Balance Overall balance assessment: Needs assistance Sitting-balance support: No upper extremity supported;Feet supported Sitting balance-Leahy Scale: Fair     Standing balance support: No upper extremity supported;During functional activity Standing balance-Leahy Scale: Fair Standing balance comment: Pt able to stand statically and dynamically without physical assist.                             Pertinent Vitals/Pain Pain Assessment: No/denies pain    Home Living Family/patient expects to be discharged to:: Private residence Living Arrangements: Parent;Other relatives (mother lives with pt and is independnet and brother staying with pt currently, wife died 11-17-24of this year) Available Help at Discharge: Family Type of Home: House Home Access: Stairs to enter Entrance Stairs-Rails: Left;Right;Can reach both Secretary/administrator of Steps: 5   Home Layout: One level Home Equipment: Rollator (4 wheels);Shower seat;Hand held shower head  Prior Function Prior Level of Function : Independent/Modified Independent;Driving (just retired Sept 30)                     Hand Dominance   Dominant Hand: Right    Extremity/Trunk Assessment   Upper Extremity Assessment Upper Extremity Assessment: Defer to OT evaluation    Lower Extremity Assessment Lower Extremity Assessment: Generalized weakness    Cervical / Trunk  Assessment Cervical / Trunk Assessment: Normal  Communication   Communication: No difficulties  Cognition Arousal/Alertness: Awake/alert Behavior During Therapy: WFL for tasks assessed/performed Overall Cognitive Status: Within Functional Limits for tasks assessed                                          General Comments General comments (skin integrity, edema, etc.): 88 bpm, 89% 2LO2,    Exercises     Assessment/Plan    PT Assessment Patient needs continued PT services  PT Problem List Decreased activity tolerance;Decreased balance;Decreased mobility;Decreased knowledge of use of DME;Decreased safety awareness;Decreased knowledge of precautions;Cardiopulmonary status limiting activity;Obesity       PT Treatment Interventions DME instruction;Gait training;Functional mobility training;Therapeutic activities;Therapeutic exercise;Balance training;Patient/family education;Stair training    PT Goals (Current goals can be found in the Care Plan section)  Acute Rehab PT Goals Patient Stated Goal: to go home PT Goal Formulation: With patient Time For Goal Achievement: 02/12/21 Potential to Achieve Goals: Good    Frequency Min 3X/week   Barriers to discharge        Co-evaluation               AM-PAC PT "6 Clicks" Mobility  Outcome Measure Help needed turning from your back to your side while in a flat bed without using bedrails?: None Help needed moving from lying on your back to sitting on the side of a flat bed without using bedrails?: None Help needed moving to and from a bed to a chair (including a wheelchair)?: None Help needed standing up from a chair using your arms (e.g., wheelchair or bedside chair)?: A Little Help needed to walk in hospital room?: A Little Help needed climbing 3-5 steps with a railing? : A Little 6 Click Score: 21    End of Session Equipment Utilized During Treatment: Gait belt;Oxygen Activity Tolerance: Patient limited by  fatigue Patient left: in bed;with call bell/phone within reach;with family/visitor present Nurse Communication: Mobility status PT Visit Diagnosis: Muscle weakness (generalized) (M62.81)    Time: 1572-6203 PT Time Calculation (min) (ACUTE ONLY): 35 min   Charges:   PT Evaluation $PT Eval Moderate Complexity: 1 Mod PT Treatments $Gait Training: 8-22 mins        Dema Timmons M,PT Acute Rehab Services [phone] (pager)   Bevelyn Buckles 01/29/2021, 10:28 AM

## 2021-01-29 NOTE — Progress Notes (Signed)
SATURATION QUALIFICATIONS: (This note is used to comply with regulatory documentation for home oxygen)  Patient Saturations on Room Air at Rest = 85%  Patient Saturations on 3 Liters of oxygen while Ambulating = 86%  Please briefly explain why patient needs home oxygen:Pt requiring 2LO2 at rest to keep sats >88% and 3L with activity to keep sats >86%.  Elenie Coven M,PT Acute Rehab Services [phone] (pager)

## 2021-01-29 NOTE — Plan of Care (Signed)

## 2021-01-30 DIAGNOSIS — R6520 Severe sepsis without septic shock: Secondary | ICD-10-CM | POA: Diagnosis not present

## 2021-01-30 DIAGNOSIS — A419 Sepsis, unspecified organism: Secondary | ICD-10-CM | POA: Diagnosis not present

## 2021-01-30 LAB — BASIC METABOLIC PANEL
Anion gap: 7 (ref 5–15)
BUN: 18 mg/dL (ref 8–23)
CO2: 28 mmol/L (ref 22–32)
Calcium: 8.8 mg/dL — ABNORMAL LOW (ref 8.9–10.3)
Chloride: 98 mmol/L (ref 98–111)
Creatinine, Ser: 1.07 mg/dL (ref 0.61–1.24)
GFR, Estimated: >60 mL/min (ref >60)
Glucose, Bld: 189 mg/dL — ABNORMAL HIGH (ref 70–99)
Potassium: 4.3 mmol/L (ref 3.5–5.1)
Sodium: 133 mmol/L — ABNORMAL LOW (ref 135–145)

## 2021-01-30 LAB — CBC
HCT: 39 % (ref 39.0–52.0)
Hemoglobin: 13.4 g/dL (ref 13.0–17.0)
MCH: 33.2 pg (ref 26.0–34.0)
MCHC: 34.4 g/dL (ref 30.0–36.0)
MCV: 96.5 fL (ref 80.0–100.0)
Platelets: 324 10*3/uL (ref 150–400)
RBC: 4.04 MIL/uL — ABNORMAL LOW (ref 4.22–5.81)
RDW: 13.2 % (ref 11.5–15.5)
WBC: 22.6 10*3/uL — ABNORMAL HIGH (ref 4.0–10.5)
nRBC: 0 % (ref 0.0–0.2)

## 2021-01-30 LAB — CULTURE, RESPIRATORY W GRAM STAIN: Culture: NORMAL

## 2021-01-30 LAB — GLUCOSE, CAPILLARY
Glucose-Capillary: 214 mg/dL — ABNORMAL HIGH (ref 70–99)
Glucose-Capillary: 267 mg/dL — ABNORMAL HIGH (ref 70–99)
Glucose-Capillary: 271 mg/dL — ABNORMAL HIGH (ref 70–99)
Glucose-Capillary: 210 mg/dL — ABNORMAL HIGH (ref 70–99)

## 2021-01-30 MED ORDER — IPRATROPIUM-ALBUTEROL 0.5-2.5 (3) MG/3ML IN SOLN
3.0000 mL | Freq: Three times a day (TID) | RESPIRATORY_TRACT | Status: DC
  Administered 2021-01-30 – 2021-01-31 (×3): 3 mL via RESPIRATORY_TRACT
  Filled 2021-01-30 (×2): qty 3

## 2021-01-30 MED ORDER — PNEUMOCOCCAL VAC POLYVALENT 25 MCG/0.5ML IJ INJ
0.5000 mL | INJECTION | INTRAMUSCULAR | Status: AC
  Administered 2021-01-31: 13:00:00 0.5 mL via INTRAMUSCULAR
  Filled 2021-01-30: qty 0.5

## 2021-01-30 MED ORDER — METHYLPREDNISOLONE SODIUM SUCC 40 MG IJ SOLR
40.0000 mg | INTRAMUSCULAR | Status: AC
  Administered 2021-01-30: 10:00:00 40 mg via INTRAVENOUS
  Filled 2021-01-30: qty 1

## 2021-01-30 MED ORDER — ONDANSETRON HCL 4 MG/2ML IJ SOLN
4.0000 mg | Freq: Four times a day (QID) | INTRAMUSCULAR | Status: DC | PRN
  Administered 2021-01-30: 11:00:00 4 mg via INTRAVENOUS
  Filled 2021-01-30: qty 2

## 2021-01-30 MED ORDER — PREDNISONE 20 MG PO TABS
40.0000 mg | ORAL_TABLET | Freq: Every day | ORAL | Status: DC
  Administered 2021-01-31: 07:00:00 40 mg via ORAL
  Filled 2021-01-30: qty 2

## 2021-01-30 MED ORDER — FUROSEMIDE 10 MG/ML IJ SOLN
40.0000 mg | Freq: Once | INTRAMUSCULAR | Status: AC
  Administered 2021-01-30: 10:00:00 40 mg via INTRAVENOUS
  Filled 2021-01-30: qty 4

## 2021-01-30 NOTE — Progress Notes (Signed)
PROGRESS NOTE    Matthew Oconnell  ZOX:[id number] DOB: 06/06/1955 DOA: 01/27/2021 PCP: Pcp, No  Brief Narrative:65/M with history of asthma, hypertension, chronic venous insufficiency, gout, leg swelling, obesity presented to the ED with progressive shortness of breath X 1 week. -Also reported cough, subjective fevers, congestion and wheezing.  Recent long drive to [place] 3 to 4 weeks prior -In the emergency room he was noted to be mildly hypoxic, tachypneic and tachycardic, CT chest was concerning for acute left upper lobe pulmonary embolism with dilation of right ventricle, and evidence of bilateral airspace opacities concerning for pneumonia   Assessment & Plan:   Principal Problem:   Severe sepsis -poa   CAP (community acquired pneumonia)   Acute asthma/COPD exacerbation Acute hypoxic respiratory failure -Clinically improving, will complete 5-day course of Levaquin -Improved air movement and no further wheezing at this time, transition to oral prednisone  -Blood cultures are negative -COVID PCR and influenza were negative -Attempt to wean O2, ambulatory O2 sats -Discharge planning  Acute pulmonary embolism -Likely triggered by morbid obesity, sedentary lifestyle and recent long drive to [place] -Treated with IV heparin initially, transitioned to oral Xarelto 11/25 -2D echo noted some RV dilation and decreased RV function, likely secondary acute PE and undiagnosed OSA, will need sleep study as outpatient  -TOC consult for sleep study  Chronic lower extremity edema Venous insufficiency -No evidence of CHF on echo, has been taking Lasix twice daily for few months -needs sleep study -We will repeat a dose of Lasix today  Morbid obesity -BMI is 44  Hyperglycemia -Likely worsened by steroids, hemoglobin A1c is still pending  DVT prophylaxis: Xarelto Code Status: Full code Family Communication: No family at bedside, discussed patient in detail Disposition Plan: Home  pending clinical improvement Status is: Inpatient  Remains inpatient appropriate because: Severity of illness    Consultants:    Procedures:   Antimicrobials:    Subjective: -Feels better, breathing continuing to improve, was able to ambulate more freely  Objective: Vitals:   01/30/21 0600 01/30/21 0731 01/30/21 0807 01/30/21 0809  BP:  (!) 119/55    Pulse: 87 98    Resp: 19 20    Temp:  98.8 F (37.1 C)    TempSrc:  Oral    SpO2: 91% 92% 91% 92%  Weight: (!) 158.5 kg     Height:        Intake/Output Summary (Last 24 hours) at 01/30/2021 1040 Last data filed at 01/30/2021 8119 Gross per 24 hour  Intake 840 ml  Output 3100 ml  Net -2260 ml   Filed Weights   01/28/21 0127 01/29/21 0547 01/30/21 0600  Weight: (!) 161.1 kg (!) 158.9 kg (!) 158.5 kg    Examination:  General exam: Obese pleasant male sitting up in bed, AAOx3, no distress HEENT: Neck obese unable to assess JVD CVS: S1-S2, regular rate rhythm Lungs: Improved air movement, no expiratory wheezes today, few basilar rhonchi Abdomen: Soft, nontender, bowel sounds present Extremities: Trace edema, chronic venous stasis changes Psychiatry: Judgement and insight appear normal. Mood & affect appropriate.     Data Reviewed:   CBC: Recent Labs  Lab 01/27/21 1820 01/28/21 0410 01/29/21 0111 01/30/21 0100  WBC 23.2* 23.1* 20.0* 22.6*  NEUTROABS 19.9*  --   --   --   HGB 14.7 14.0 13.5 13.4  HCT 42.4 41.5 39.1 39.0  MCV 95.5 95.6 95.6 96.5  PLT 272 281 299 324   Basic Metabolic Panel: Recent Labs  Lab  01/27/21 1820 01/28/21 0410 01/29/21 0111 01/30/21 0100  NA 130* 132* 131* 133*  K 4.0 4.3 4.2 4.3  CL 96* 98 98 98  CO2 24 24 25 28   GLUCOSE 182* 255* 304* 189*  BUN 16 15 16 18   CREATININE 1.30* 1.20 1.08 1.07  CALCIUM 8.7* 8.7* 8.9 8.8*  MG  --  2.3  2.5* 2.7*  --   PHOS  --  2.9 2.7  --    GFR: Estimated Creatinine Clearance: 111.1 mL/min (by C-G formula based on SCr of 1.07  mg/dL). Liver Function Tests: Recent Labs  Lab 01/27/21 1820 01/28/21 0410  AST 49* 48*  ALT 43 47*  ALKPHOS 119 124  BILITOT 1.5* 1.0  PROT 6.7 7.1  ALBUMIN 2.8* 2.6*   No results for input(s): LIPASE, AMYLASE in the last 168 hours. No results for input(s): AMMONIA in the last 168 hours. Coagulation Profile: No results for input(s): INR, PROTIME in the last 168 hours. Cardiac Enzymes: No results for input(s): CKTOTAL, CKMB, CKMBINDEX, TROPONINI in the last 168 hours. BNP (last 3 results) No results for input(s): PROBNP in the last 8760 hours. HbA1C: No results for input(s): HGBA1C in the last 72 hours. CBG: Recent Labs  Lab 01/29/21 1650 01/29/21 2112 01/30/21 0605  GLUCAP 338* 202* 214*   Lipid Profile: Recent Labs    01/27/21 1901  TRIG 87   Thyroid Function Tests: Recent Labs    01/28/21 0648  TSH 0.542   Anemia Panel: Recent Labs    01/27/21 1924  FERRITIN 1,204*   Urine analysis:    Component Value Date/Time   COLORURINE AMBER (A) 01/27/2021 2158   APPEARANCEUR HAZY (A) 01/27/2021 2158   LABSPEC >1.046 (H) 01/27/2021 2158   PHURINE 5.0 01/27/2021 2158   GLUCOSEU NEGATIVE 01/27/2021 2158   HGBUR SMALL (A) 01/27/2021 2158   BILIRUBINUR NEGATIVE 01/27/2021 2158   KETONESUR 5 (A) 01/27/2021 2158   PROTEINUR 100 (A) 01/27/2021 2158   NITRITE NEGATIVE 01/27/2021 2158   LEUKOCYTESUR NEGATIVE 01/27/2021 2158   Sepsis Labs: @LABRCNTIP (procalcitonin:4,lacticidven:4)  ) Recent Results (from the past 240 hour(s))  Resp Panel by RT-PCR (Flu A&B, Covid) Nasopharyngeal Swab     Status: None   Collection Time: 01/27/21  7:01 PM   Specimen: Nasopharyngeal Swab; Nasopharyngeal(NP) swabs in vial transport medium  Result Value Ref Range Status   SARS Coronavirus 2 by RT PCR NEGATIVE NEGATIVE Final    Comment: (NOTE) SARS-CoV-2 target nucleic acids are NOT DETECTED.  The SARS-CoV-2 RNA is generally detectable in upper respiratory specimens during the  acute phase of infection. The lowest concentration of SARS-CoV-2 viral copies this assay can detect is 138 copies/mL. A negative result does not preclude SARS-Cov-2 infection and should not be used as the sole basis for treatment or other patient management decisions. A negative result may occur with  improper specimen collection/handling, submission of specimen other than nasopharyngeal swab, presence of viral mutation(s) within the areas targeted by this assay, and inadequate number of viral copies(<138 copies/mL). A negative result must be combined with clinical observations, patient history, and epidemiological information. The expected result is Negative.  Fact Sheet for Patients:  BloggerCourse.com  Fact Sheet for Healthcare Providers:  SeriousBroker.it  This test is no t yet approved or cleared by the [place] FDA and  has been authorized for detection and/or diagnosis of SARS-CoV-2 by FDA under an Emergency Use Authorization (EUA). This EUA will remain  in effect (meaning this test can be used) for the  duration of the COVID-19 declaration under Section 564(b)(1) of the Act, 21 U.S.C.section 360bbb-3(b)(1), unless the authorization is terminated  or revoked sooner.       Influenza A by PCR NEGATIVE NEGATIVE Final   Influenza B by PCR NEGATIVE NEGATIVE Final    Comment: (NOTE) The Xpert Xpress SARS-CoV-2/FLU/RSV plus assay is intended as an aid in the diagnosis of influenza from Nasopharyngeal swab specimens and should not be used as a sole basis for treatment. Nasal washings and aspirates are unacceptable for Xpert Xpress SARS-CoV-2/FLU/RSV testing.  Fact Sheet for Patients: BloggerCourse.com  Fact Sheet for Healthcare Providers: SeriousBroker.it  This test is not yet approved or cleared by the [place] FDA and has been authorized for detection and/or  diagnosis of SARS-CoV-2 by FDA under an Emergency Use Authorization (EUA). This EUA will remain in effect (meaning this test can be used) for the duration of the COVID-19 declaration under Section 564(b)(1) of the Act, 21 U.S.C. section 360bbb-3(b)(1), unless the authorization is terminated or revoked.  Performed at [facility], [address], [place], [place] [address]   Blood Culture (routine x 2)     Status: None (Preliminary result)   Collection Time: 01/27/21  8:29 PM   Specimen: BLOOD RIGHT HAND  Result Value Ref Range Status   Specimen Description BLOOD RIGHT HAND  Final   Special Requests   Final    BOTTLES DRAWN AEROBIC AND ANAEROBIC Blood Culture adequate volume   Culture   Final    NO GROWTH 3 DAYS Performed at [facility], [address], [place], [place] [address]    Report Status PENDING  Incomplete  Culture, blood (Routine X 2) w Reflex to ID Panel     Status: None (Preliminary result)   Collection Time: 01/27/21  8:31 PM   Specimen: BLOOD LEFT HAND  Result Value Ref Range Status   Specimen Description BLOOD LEFT HAND  Final   Special Requests   Final    BOTTLES DRAWN AEROBIC AND ANAEROBIC Blood Culture adequate volume   Culture   Final    NO GROWTH 3 DAYS Performed at [facility], [address] [place], [place] [address]    Report Status PENDING  Incomplete  MRSA Next Gen by PCR, Nasal     Status: None   Collection Time: 01/28/21  2:12 AM   Specimen: Nasal Mucosa; Nasal Swab  Result Value Ref Range Status   MRSA by PCR Next Gen NOT DETECTED NOT DETECTED Final    Comment: (NOTE) The GeneXpert MRSA Assay (FDA approved for NASAL specimens only), is one component of a comprehensive MRSA colonization surveillance program. It is not intended to diagnose MRSA infection nor to guide or monitor treatment for MRSA infections. Test performance is not FDA approved in patients less than 37 years old. Performed at [facility], [address] [place], [place] [address]   Expectorated Sputum Assessment w Gram Stain, Rflx to Resp Cult     Status: None (Preliminary result)   Collection Time: 01/28/21  8:35 PM   Specimen: Expectorated Sputum  Result Value Ref Range Status   Specimen Description EXPECTORATED SPUTUM  Final   Special Requests NONE  Final   Sputum evaluation   Final    THIS SPECIMEN IS ACCEPTABLE FOR SPUTUM CULTURE Performed at [facility], [address] [place], [place] [address]    Report Status PENDING  Incomplete  Culture, Respiratory w Gram Stain  Status: None (Preliminary result)   Collection Time: 01/28/21  8:35 PM  Result Value Ref Range Status   Specimen Description EXPECTORATED SPUTUM  Final   Special Requests NONE Reflexed from [id number]  Final   Gram Stain   Final    RARE SQUAMOUS EPITHELIAL CELLS PRESENT RARE WBC PRESENT, PREDOMINANTLY MONONUCLEAR FEW GRAM POSITIVE COCCI IN PAIRS FEW GRAM NEGATIVE COCCI RARE GRAM VARIABLE ROD    Culture   Final    RARE Normal respiratory flora-no Staph aureus or Pseudomonas seen Performed at [facility], [address] [place], [place] [address]    Report Status PENDING  Incomplete         Radiology Studies: ECHOCARDIOGRAM COMPLETE  Result Date: 01/28/2021    ECHOCARDIOGRAM REPORT   Patient Name:   CAYLEB JARNIGAN Date of Exam: 01/28/2021 Medical Rec #:  [id number]        Height:       75.0 in Accession #:    [id number]       Weight:       355.2 lb Date of Birth:  16-Sep-1955         BSA:          2.801 m Patient Age:    65 years         BP:           152/82 mmHg Patient Gender: M                HR:           96 bpm. Exam Location:  Inpatient Procedure: 2D Echo and Intracardiac Opacification Agent Indications:    pulmonary embolus  History:        Patient has no prior history of Echocardiogram examinations.                 Pneumonia. sepsis., Signs/Symptoms:Dyspnea; Risk                 Factors:Hypertension.  Sonographer:    Delcie Roch RDCS Referring Phys: [id number] JUSTIN B HOWERTER IMPRESSIONS  1. Left ventricular ejection fraction, by estimation, is 60 to 65%. The left ventricle has normal function. The left ventricle has no regional wall motion abnormalities. Left ventricular diastolic parameters were normal.  2. Right ventricular systolic function is moderately reduced. The right ventricular size is moderately enlarged.  3. Left atrial size was mildly dilated.  4. The mitral valve is normal in structure. No evidence of mitral valve regurgitation. No evidence of mitral stenosis.  5. The aortic valve is tricuspid. Aortic valve regurgitation is not visualized. No aortic stenosis is present.  6. Aortic dilatation noted. There is mild dilatation of the ascending aorta, measuring 38 mm.  7. The inferior vena cava is dilated in size with >50% respiratory variability, suggesting right atrial pressure of 8 mmHg. FINDINGS  Left Ventricle: Left ventricular ejection fraction, by estimation, is 60 to 65%. The left ventricle has normal function. The left ventricle has no regional wall motion abnormalities. Definity contrast agent was given IV to delineate the left ventricular  endocardial borders. The left ventricular internal cavity size was normal in size. There is no left ventricular hypertrophy. Left ventricular diastolic parameters were normal. Right Ventricle: The right ventricular size is moderately enlarged. No increase in right ventricular wall thickness. Right ventricular systolic function is moderately reduced. Left Atrium: Left atrial size was mildly dilated. Right Atrium: Right atrial size was normal in size. Pericardium: There is no evidence of  pericardial effusion. Mitral Valve: The mitral valve is normal in structure. There is mild thickening of the mitral valve leaflet(s). There is moderate calcification of the mitral valve leaflet(s). No evidence of mitral valve regurgitation. No evidence of mitral valve stenosis. Tricuspid  Valve: The tricuspid valve is normal in structure. Tricuspid valve regurgitation is not demonstrated. No evidence of tricuspid stenosis. Aortic Valve: The aortic valve is tricuspid. Aortic valve regurgitation is not visualized. No aortic stenosis is present. Pulmonic Valve: The pulmonic valve was normal in structure. Pulmonic valve regurgitation is not visualized. No evidence of pulmonic stenosis. Aorta: The aortic root is normal in size and structure and aortic dilatation noted. There is mild dilatation of the ascending aorta, measuring 38 mm. Venous: The inferior vena cava is dilated in size with greater than 50% respiratory variability, suggesting right atrial pressure of 8 mmHg. IAS/Shunts: No atrial level shunt detected by color flow Doppler.  LEFT VENTRICLE PLAX 2D LVIDd:         5.30 cm   Diastology LVIDs:         3.70 cm   LV e' lateral: 12.00 cm/s LV PW:         1.20 cm LV IVS:        1.40 cm LVOT diam:     2.20 cm LV SV:         82 LV SV Index:   29 LVOT Area:     3.80 cm  RIGHT VENTRICLE             IVC RV S prime:     17.10 cm/s  IVC diam: 2.20 cm TAPSE (M-mode): 3.1 cm LEFT ATRIUM              Index        RIGHT ATRIUM           Index LA diam:        4.30 cm  1.54 cm/m   RA Area:     21.40 cm LA Vol (A2C):   80.2 ml  28.63 ml/m  RA Volume:   62.60 ml  22.35 ml/m LA Vol (A4C):   102.0 ml 36.42 ml/m LA Biplane Vol: 99.2 ml  35.42 ml/m  AORTIC VALVE LVOT Vmax:   134.00 cm/s LVOT Vmean:  84.900 cm/s LVOT VTI:    0.217 m  AORTA Ao Root diam: 3.60 cm Ao Asc diam:  3.80 cm  SHUNTS Systemic VTI:  0.22 m Systemic Diam: 2.20 cm Charlton Haws MD Electronically signed by Charlton Haws MD Signature Date/Time: 01/28/2021/4:33:14 PM    Final      Scheduled Meds:  insulin aspart  0-15 Units Subcutaneous TID WC   ipratropium-albuterol  3 mL Nebulization QID   levofloxacin  750 mg Oral q1800   loratadine  10 mg Oral Daily   mouth rinse  15 mL Mouth Rinse BID   mometasone-formoterol  2 puff Inhalation BID    montelukast  10 mg Oral Daily   [START ON 01/31/2021] pneumococcal 23 valent vaccine  0.5 mL Intramuscular Tomorrow-1000   [START ON 01/31/2021] predniSONE  40 mg Oral Q breakfast   rivaroxaban  15 mg Oral BID WC   Followed by   Melene Muller ON 02/18/2021] rivaroxaban  20 mg Oral Q supper   Continuous Infusions:   LOS: 3 days    Time spent: 25 min  Zannie Cove, MD [facility]   01/30/2021, 10:40 AM

## 2021-01-31 ENCOUNTER — Other Ambulatory Visit (HOSPITAL_COMMUNITY): Payer: Self-pay

## 2021-01-31 DIAGNOSIS — A419 Sepsis, unspecified organism: Secondary | ICD-10-CM | POA: Diagnosis not present

## 2021-01-31 DIAGNOSIS — R6520 Severe sepsis without septic shock: Secondary | ICD-10-CM | POA: Diagnosis not present

## 2021-01-31 LAB — CBC
HCT: 42 % (ref 39.0–52.0)
Hemoglobin: 13.8 g/dL (ref 13.0–17.0)
MCH: 32 pg (ref 26.0–34.0)
MCHC: 32.9 g/dL (ref 30.0–36.0)
MCV: 97.4 fL (ref 80.0–100.0)
Platelets: 363 10*3/uL (ref 150–400)
RBC: 4.31 MIL/uL (ref 4.22–5.81)
RDW: 13.2 % (ref 11.5–15.5)
WBC: 19.1 10*3/uL — ABNORMAL HIGH (ref 4.0–10.5)
nRBC: 0 % (ref 0.0–0.2)

## 2021-01-31 LAB — BASIC METABOLIC PANEL
Anion gap: 10 (ref 5–15)
BUN: 17 mg/dL (ref 8–23)
CO2: 28 mmol/L (ref 22–32)
Calcium: 8.8 mg/dL — ABNORMAL LOW (ref 8.9–10.3)
Chloride: 95 mmol/L — ABNORMAL LOW (ref 98–111)
Creatinine, Ser: 1 mg/dL (ref 0.61–1.24)
GFR, Estimated: >60 mL/min (ref >60)
Glucose, Bld: 221 mg/dL — ABNORMAL HIGH (ref 70–99)
Potassium: 4.3 mmol/L (ref 3.5–5.1)
Sodium: 133 mmol/L — ABNORMAL LOW (ref 135–145)

## 2021-01-31 LAB — EXPECTORATED SPUTUM ASSESSMENT W GRAM STAIN, RFLX TO RESP C

## 2021-01-31 LAB — HEMOGLOBIN A1C
Hgb A1c MFr Bld: 6.5 % — ABNORMAL HIGH (ref 4.8–5.6)
Mean Plasma Glucose: 140 mg/dL

## 2021-01-31 LAB — GLUCOSE, CAPILLARY
Glucose-Capillary: 235 mg/dL — ABNORMAL HIGH (ref 70–99)
Glucose-Capillary: 359 mg/dL — ABNORMAL HIGH (ref 70–99)

## 2021-01-31 MED ORDER — LEVOFLOXACIN 750 MG PO TABS
750.0000 mg | ORAL_TABLET | Freq: Every day | ORAL | 0 refills | Status: AC
  Filled 2021-01-31: qty 2, 2d supply, fill #0

## 2021-01-31 MED ORDER — PREDNISONE 20 MG PO TABS
20.0000 mg | ORAL_TABLET | Freq: Every day | ORAL | 0 refills | Status: AC
  Filled 2021-01-31: qty 3, 3d supply, fill #0

## 2021-01-31 NOTE — Plan of Care (Signed)
  Problem: Health Behavior/Discharge Planning: Goal: Ability to manage health-related needs will improve Outcome: Adequate for Discharge   Problem: Clinical Measurements: Goal: Ability to maintain clinical measurements within normal limits will improve Outcome: Adequate for Discharge Goal: Diagnostic test results will improve Outcome: Adequate for Discharge Goal: Respiratory complications will improve Outcome: Adequate for Discharge Goal: Cardiovascular complication will be avoided Outcome: Adequate for Discharge   Problem: Activity: Goal: Risk for activity intolerance will decrease Outcome: Adequate for Discharge   Problem: Coping: Goal: Level of anxiety will decrease Outcome: Adequate for Discharge   Problem: Pain Managment: Goal: General experience of comfort will improve Outcome: Adequate for Discharge   Problem: Safety: Goal: Ability to remain free from injury will improve Outcome: Adequate for Discharge   Problem: Education: Goal: Knowledge of General Education information will improve Description: Including pain rating scale, medication(s)/side effects and non-pharmacologic comfort measures Outcome: Completed/Met   Problem: Clinical Measurements: Goal: Will remain free from infection Outcome: Completed/Met   Problem: Nutrition: Goal: Adequate nutrition will be maintained Outcome: Completed/Met   Problem: Elimination: Goal: Will not experience complications related to bowel motility Outcome: Not Applicable Goal: Will not experience complications related to urinary retention Outcome: Not Applicable   Problem: Skin Integrity: Goal: Risk for impaired skin integrity will decrease Outcome: Not Applicable

## 2021-01-31 NOTE — TOC Transition Note (Signed)
Transition of Care [facility]) - CM/SW Discharge Note   Patient Details  Name: Matthew Oconnell MRN: [id number] Date of Birth: 07/04/1955  Transition of Care [facility]) CM/SW Contact:  Leone Haven, RN Phone Number: 01/31/2021, 12:16 PM   Clinical Narrative:    Patient is for dc today, NCM spoke with him at bedside, he will be on xarelto, which is in the Main Pharmacy, the Staff Nurse will get it from the pharmacy prior to dc.  Also he will need home oxygen, he is ok with [employer] supplying this for him.  He states he has transportation home today.  Also he states he will set up his own PCP apt with a doctor in [place].  Doctor will make referral to [facility] for sleep study.   Final next level of care: Home/Self Care Barriers to Discharge: No Barriers Identified   Patient Goals and CMS Choice        Discharge Placement                       Discharge Plan and Services                DME Arranged: Oxygen DME Agency: [employer] Date DME Agency Contacted: 01/31/21 Time DME Agency Contacted: 1215 Representative spoke with at DME Agency: Ian Malkin HH Arranged: NA          Social Determinants of Health (SDOH) Interventions     Readmission Risk Interventions No flowsheet data found.

## 2021-01-31 NOTE — Progress Notes (Signed)
Patient had and episode of coughing and used the urinal, his HR increased to 120s ST. Patient was asymptomatic. This remained for 1 hr while patient was resting until back to baseline <100bpm SR. MD aware.

## 2021-01-31 NOTE — Progress Notes (Addendum)
Patient ambulated in room and HR increased to 120s/130s ST. Patient asymptomatic. Patient rested in bed and it returned to baseline <100bpm after 45 min.

## 2021-01-31 NOTE — TOC Benefit Eligibility Note (Signed)
Transition of Care [facility]) Benefit Eligibility Note    Patient Details  Name: Matthew Oconnell MRN: [id number] Date of Birth: [birth year]   Medication/Dose: Carlena Hurl 15 MG BIUD : CO-PAY- $ 55.00    and   XARELTO 20 MG DAILY :CO-PAY- $55.00  Covered?: Yes  Tier:  (PREFERRED)  Prescription Coverage [facility] [address] , [facility]  Spoke with Person/Company/Phone Number:: BRI  @ [facility] RX # [phone] OPT-MEMBER  Co-Pay: $55.00  Prior Approval: No  Deductible: Unmet (OUT-OF-POCKET:UNMET)       Mardene Sayer Phone Number: 01/31/2021, 1:34 PM

## 2021-01-31 NOTE — Progress Notes (Signed)
Physical Therapy Treatment Patient Details Name: Matthew Oconnell MRN: [id number] DOB: Dec 26, 1955 Today's Date: 01/31/2021   History of Present Illness Matthew Oconnell is a 65 y.o. male admitted 11/24 with severe sepsis due to community-acquired pneumonia after presenting from home to [facility] ED complaining of shortness of breath.  + for COPD exacerbation and acute PE. PMH: mild persistent asthma, hypertension, chronic venous insufficiency    PT Comments    Patient progressing with mobility in the room.  Preferred walking laps in room to in hallway.  He states his wife recently passed so discussed family support and very slow gradual return to activities.  Patient also educated on fall prevention and energy conservation.  Plans for possible d/c home today.  Again recommend no PT follow up at this time.    Recommendations for follow up therapy are one component of a multi-disciplinary discharge planning process, led by the attending physician.  Recommendations may be updated based on patient status, additional functional criteria and insurance authorization.  Follow Up Recommendations  No PT follow up     Assistance Recommended at Discharge PRN  Equipment Recommendations  None recommended by PT    Recommendations for Other Services       Precautions / Restrictions Precautions Precautions: Fall     Mobility  Bed Mobility Overal bed mobility: Independent             General bed mobility comments: HOB up    Transfers Overall transfer level: Independent Equipment used: None               General transfer comment: UE use to stand from bed at regular height    Ambulation/Gait Ambulation/Gait assistance: Supervision Gait Distance (Feet): 160 Feet (staying in room per pt request, laps to door and back to window) Assistive device: None Gait Pattern/deviations: Step-through pattern;Decreased stride length       General Gait Details: mild DOE with ambulation on 3L O2, no  LOB, reports no dizziness despite frequent turns walking in room   Stairs             Wheelchair Mobility    Modified Rankin (Stroke Patients Only)       Balance Overall balance assessment: Needs assistance   Sitting balance-Leahy Scale: Good Sitting balance - Comments: leaning down to pull on shoes   Standing balance support: No upper extremity supported Standing balance-Leahy Scale: Good Standing balance comment: walking in room no LOB                            Cognition Arousal/Alertness: Awake/alert Behavior During Therapy: WFL for tasks assessed/performed Overall Cognitive Status: Within Functional Limits for tasks assessed                                 General Comments: reports wife passed away Nov 25, 202465this year.  States has to find new normal.  Has support from children.  Provided emotional support and active listening.        Exercises      General Comments General comments (skin integrity, edema, etc.): SpO2 87-91% on 3L O2 with ambulation, HR max 128; educated on fall prevention for home including lighting, footwear, taking his time getting up, items used frequently kept on counter and no obstacles in the way for bathroom.  Has a shower seat and discussed lukewarm water and energy conservation strategies  including gradual return to activities and relying on his family for a while.      Pertinent Vitals/Pain Pain Assessment: No/denies pain    Home Living                          Prior Function            PT Goals (current goals can now be found in the care plan section) Progress towards PT goals: Progressing toward goals    Frequency    Min 3X/week      PT Plan Current plan remains appropriate    Co-evaluation              AM-PAC PT "6 Clicks" Mobility   Outcome Measure  Help needed turning from your back to your side while in a flat bed without using bedrails?: None Help needed moving from  lying on your back to sitting on the side of a flat bed without using bedrails?: None Help needed moving to and from a bed to a chair (including a wheelchair)?: None Help needed standing up from a chair using your arms (e.g., wheelchair or bedside chair)?: None Help needed to walk in hospital room?: None Help needed climbing 3-5 steps with a railing? : A Little 6 Click Score: 23    End of Session Equipment Utilized During Treatment: Oxygen Activity Tolerance: Patient limited by fatigue Patient left: in bed;with call bell/phone within reach   PT Visit Diagnosis: Muscle weakness (generalized) (M62.81)     Time: 3435-6861 PT Time Calculation (min) (ACUTE ONLY): 30 min  Charges:  $Gait Training: 8-22 mins $Self Care/Home Management: 8-22                     Sheran Lawless, PT Acute Rehabilitation Services [phone] [phone] 01/31/2021    Matthew Oconnell 01/31/2021, 11:02 AM

## 2021-01-31 NOTE — Progress Notes (Signed)
SATURATION QUALIFICATIONS: (This note is used to comply with regulatory documentation for home oxygen)  Patient Saturations on Room Air at Rest = 88%  Patient Saturations on Room Air while Ambulating = 86%  Patient Saturations on 3 Liters of oxygen while Ambulating = 90%

## 2021-01-31 NOTE — Progress Notes (Signed)
Inpatient Diabetes Program Recommendations  AACE/ADA: New Consensus Statement on Inpatient Glycemic Control (2015)  Target Ranges:  Prepandial:   less than 140 mg/dL      Peak postprandial:   less than 180 mg/dL (1-2 hours)      Critically ill patients:  140 - 180 mg/dL   Lab Results  Component Value Date   GLUCAP 235 (H) 01/31/2021   HGBA1C 6.5 (H) 01/29/2021    Review of Glycemic Control  Latest Reference Range & Units 01/30/21 06:05 01/30/21 11:06 01/30/21 16:31 01/30/21 21:25 01/31/21 06:36  Glucose-Capillary 70 - 99 mg/dL 510 (H) 258 (H) 527 (H) 210 (H) 235 (H)   Diabetes history: No prior hx DM Outpatient Diabetes medications: None Current orders for Inpatient glycemic control: Novolog correction 0-15 units tid, Prednisone 40 mg qd  Inpatient Diabetes Program Recommendations:   Consider while on steroids: -Add Novolog 3 units tid meal coverage if eats 50% -Add Novolog 0-5 units hs Noted A1c 6.5 but has been on steroids.  Secure chat sent to Dr. Jomarie Longs.  Thank you, Billy Fischer. Berlyn Malina, RN, MSN, CDE  Diabetes Coordinator Inpatient Glycemic Control Team Team Pager [phone] (8am-5pm) 01/31/2021 10:48 AM

## 2021-02-01 LAB — CULTURE, BLOOD (ROUTINE X 2)
Culture: NO GROWTH
Culture: NO GROWTH
Special Requests: ADEQUATE
Special Requests: ADEQUATE

## 2021-02-04 ENCOUNTER — Encounter: Payer: Self-pay | Admitting: Registered Nurse

## 2021-02-04 ENCOUNTER — Ambulatory Visit: Payer: Medicare Other | Admitting: Registered Nurse

## 2021-02-04 ENCOUNTER — Other Ambulatory Visit: Payer: Self-pay

## 2021-02-04 VITALS — BP 113/62 | HR 91 | Temp 98.2°F | Resp 18 | Ht 75.0 in | Wt 343.2 lb

## 2021-02-04 DIAGNOSIS — I10 Essential (primary) hypertension: Secondary | ICD-10-CM

## 2021-02-04 DIAGNOSIS — J4540 Moderate persistent asthma, uncomplicated: Secondary | ICD-10-CM

## 2021-02-04 DIAGNOSIS — R0681 Apnea, not elsewhere classified: Secondary | ICD-10-CM | POA: Diagnosis not present

## 2021-02-04 DIAGNOSIS — Z09 Encounter for follow-up examination after completed treatment for conditions other than malignant neoplasm: Secondary | ICD-10-CM | POA: Diagnosis not present

## 2021-02-04 DIAGNOSIS — J302 Other seasonal allergic rhinitis: Secondary | ICD-10-CM

## 2021-02-04 DIAGNOSIS — I2699 Other pulmonary embolism without acute cor pulmonale: Secondary | ICD-10-CM

## 2021-02-04 LAB — CBC WITH DIFFERENTIAL/PLATELET
Basophils Absolute: 0.1 10*3/uL (ref 0.0–0.1)
Basophils Relative: 0.5 % (ref 0.0–3.0)
Eosinophils Absolute: 0.1 10*3/uL (ref 0.0–0.7)
Eosinophils Relative: 0.6 % (ref 0.0–5.0)
HCT: 45.9 % (ref 39.0–52.0)
Hemoglobin: 15.4 g/dL (ref 13.0–17.0)
Lymphocytes Relative: 12.6 % (ref 12.0–46.0)
Lymphs Abs: 1.7 10*3/uL (ref 0.7–4.0)
MCHC: 33.5 g/dL (ref 30.0–36.0)
MCV: 96.3 fl (ref 78.0–100.0)
Monocytes Absolute: 0.9 10*3/uL (ref 0.1–1.0)
Monocytes Relative: 6.6 % (ref 3.0–12.0)
Neutro Abs: 10.7 10*3/uL — ABNORMAL HIGH (ref 1.4–7.7)
Neutrophils Relative %: 79.7 % — ABNORMAL HIGH (ref 43.0–77.0)
Platelets: 405 10*3/uL — ABNORMAL HIGH (ref 150.0–400.0)
RBC: 4.77 Mil/uL (ref 4.22–5.81)
RDW: 13.5 % (ref 11.5–15.5)
WBC: 13.4 10*3/uL — ABNORMAL HIGH (ref 4.0–10.5)

## 2021-02-04 LAB — COMPREHENSIVE METABOLIC PANEL
ALT: 40 U/L (ref 0–53)
AST: 24 U/L (ref 0–37)
Albumin: 3.5 g/dL (ref 3.5–5.2)
Alkaline Phosphatase: 79 U/L (ref 39–117)
BUN: 15 mg/dL (ref 6–23)
CO2: 29 mEq/L (ref 19–32)
Calcium: 9.6 mg/dL (ref 8.4–10.5)
Chloride: 95 mEq/L — ABNORMAL LOW (ref 96–112)
Creatinine, Ser: 1.05 mg/dL (ref 0.40–1.50)
GFR: 74.54 mL/min (ref >60.00)
Glucose, Bld: 162 mg/dL — ABNORMAL HIGH (ref 70–99)
Potassium: 4.3 mEq/L (ref 3.5–5.1)
Sodium: 133 mEq/L — ABNORMAL LOW (ref 135–145)
Total Bilirubin: 0.7 mg/dL (ref 0.2–1.2)
Total Protein: 7.4 g/dL (ref 6.0–8.3)

## 2021-02-04 MED ORDER — FLUTICASONE-SALMETEROL 250-50 MCG/ACT IN AEPB: 1.0000 | INHALATION_SPRAY | Freq: Two times a day (BID) | RESPIRATORY_TRACT | 11 refills | Status: DC

## 2021-02-04 MED ORDER — IPRATROPIUM BROMIDE 0.03 % NA SOLN: 2.0000 | Freq: Every day | NASAL | 11 refills | Status: DC | PRN

## 2021-02-04 MED ORDER — MONTELUKAST SODIUM 10 MG PO TABS: 10.0000 mg | ORAL_TABLET | Freq: Every day | ORAL | 3 refills | Status: DC

## 2021-02-04 MED ORDER — AZELASTINE HCL 0.1 % NA SOLN: 1.0000 | Freq: Two times a day (BID) | NASAL | 12 refills | Status: DC

## 2021-02-04 NOTE — Patient Instructions (Addendum)
[name] -   Matthew Oconnell to meet you.  I'll let you know how labs look and if there are any concerns.   I have refilled nasal sprays - use one in the morning, one at night. Singulair refill will help too.  Let me know if you need any other refills.  I have referred to [facility] Med and Cardiology - they will call you to schedule.  I want to see you in 3 months to repeat some labs  Thank you  Matthew Oconnell     If you have lab work done today you will be contacted with your lab results within the next 2 weeks.  If you have not heard from [place] then please contact us. The fastest way to get your results is to register for My Chart.   IF you received an x-ray today, you will receive an invoice from [facility]. Please contact [facility] at [phone] with questions or concerns regarding your invoice.   IF you received labwork today, you will receive an invoice from [facility]. Please contact [facility] at [phone] with questions or concerns regarding your invoice.   Our billing staff will not be able to assist you with questions regarding bills from these companies.  You will be contacted with the lab results as soon as they are available. The fastest way to get your results is to activate your My Chart account. Instructions are located on the last page of this paperwork. If you have not heard from [place] regarding the results in 2 weeks, please contact this office.

## 2021-02-04 NOTE — Progress Notes (Signed)
Established Patient Office Visit  Subjective:  Patient ID: Matthew Oconnell, male    DOB: 1955-04-19  Age: 65 y.o. MRN: [id number]  CC:  Chief Complaint  Patient presents with   New Patient (Initial Visit)    Patient states he is here to establish care. Patient states he just got out of the hospital and had pneumonia and blood clot in one of his lungs as well.    HPI Matthew Oconnell presents for visit to est care.   Recent hospital discharge Admitted on 01/27/21 with severe sepsis secondary to CAP after presenting form home to ED with shob. 5 days of progressive symptoms preceding this - felt feverish - no chest pain or hemoptysis at that time. No orthopnea or pnd, no evidence of DVT on arrival, no hx of clot.  CBC showed WBC 23000, d-dimer to 2.83. CTA showed PE in L upper lobe.   Given albuterol, heparin, IVF, solumedrol IV, levaquin in ER. Admitted to PCU.  Stabilized. Most recent blood and sputum cultures negative.   Discharged on 01/31/21 with levaquin, prednisone, lasix, xarelto, duoneb, lisinopril, singulair, allegra, advair, atrovent nasal spray.  Notes since being home feeling much better. Spo2 doing better at home without oxygen, HR stabilizing. No worsening symptoms.   Noted in ER to be diabetic with A1c 6.5    Grief: Lost his wife 2 weeks ago- starting feeling sick soon after. Feeling down lately. Has good support from family. Deneis hi /si Declines medication at this time  Allergies Nasal mucus and congestion  Does ok with atrovent nasal spray, needs refill Notes some nocturnal pnd that interferes with his sleep.  Apnea Witnessed apnea, snores, wakes with dull headache and dry mouth Was told in hospital to get sleep study when discharged Will refer for sleep study  PE Acute in hospital He does note drive to and from [place] in the 1-2 weeks before the event, was noted in setting of CAP. Continues on xarelto No evidence of worsening No hx of  clot.  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   Past Medical History:  Diagnosis Date   Asthmatic bronchitis with acute exacerbation    Cellulitis    Ganglion cyst    Of Left Hand   Gout    Hypertension    Hyperuricemia    Leg swelling    Mild depression    Mild persistent asthma    Morbid obesity (HCC)    Osteoarthritis    Prediabetes    Recurrent sinusitis    Redness    Bilateral lower extremities   Stasis dermatitis of both legs    Venous insufficiency     Past Surgical History:  Procedure Laterality Date   COLONOSCOPY N/A 05/04/2008   ganglion cyst removed from L hand Left    Right hand repair for a compound break Right     History reviewed. No pertinent family history.  Social History   Socioeconomic History   Marital status: Widowed    Spouse name: Not on file   Number of children: Not on file   Years of education: Not on file   Highest education level: Not on file  Occupational History   Not on file  Tobacco Use   Smoking status: Former    Types: Cigarettes    Quit date: 2015    Years since quitting: 7.9   Smokeless tobacco: Never  Vaping Use   Vaping Use: Never used  Substance and Sexual Activity   Alcohol use: Yes    Comment: occ   Drug use: Not Currently   Sexual activity: Not on file  Other Topics Concern   Not on file  Social History Narrative   Not on file   Social Determinants of Health   Financial Resource Strain: Not on file  Food Insecurity: Not on file  Transportation Needs: Not on file  Physical Activity: Not on file  Stress: Not on file  Social Connections: Not on file  Intimate Partner Violence: Not on file    Outpatient Medications Prior to Visit  Medication Sig Dispense Refill   acetaminophen (TYLENOL) 500 MG tablet Take 1,000 mg by mouth every 6 (six) hours as needed for moderate pain, headache or fever.     fexofenadine (ALLEGRA) 180 MG tablet Take 180 mg by mouth daily  as needed for allergies or rhinitis.     fluticasone (FLONASE) 50 MCG/ACT nasal spray 2 sprays daily as needed for allergies.     furosemide (LASIX) 20 MG tablet Take 20 mg by mouth 2 (two) times daily.     lisinopril (ZESTRIL) 10 MG tablet Take 10 mg by mouth daily.     predniSONE (DELTASONE) 20 MG tablet Take 1 tablet (20 mg total) by mouth daily with breakfast for 3 days. 3 tablet 0   RIVAROXABAN (XARELTO) VTE STARTER PACK (15 & 20 MG) Take one 15mg  tablet by mouth twice a day until 02/17/21. On 02/18/21, switch to one 20mg  tablet once a day. Take with food. 51 each 2   fluticasone-salmeterol (ADVAIR) 250-50 MCG/ACT AEPB 1 puff 2 (two) times daily.     ipratropium (ATROVENT) 0.03 % nasal spray Place 2 sprays into both nostrils daily as needed for rhinitis.     montelukast (SINGULAIR) 10 MG tablet Take 10 mg by mouth daily.     No facility-administered medications prior to visit.    Allergies  Allergen Reactions   Cefuroxime Rash   Sulfamethoxazole-Trimethoprim Rash    ROS Review of Systems  Constitutional: Negative.   HENT: Negative.    Eyes: Negative.   Respiratory: Negative.    Cardiovascular: Negative.   Gastrointestinal: Negative.   Genitourinary: Negative.   Musculoskeletal: Negative.   Skin: Negative.   Neurological: Negative.   Psychiatric/Behavioral: Negative.    All other systems reviewed and are negative.    Objective:    Physical Exam Constitutional:      General: He is not in acute distress.    Appearance: Normal appearance. He is normal weight. He is not ill-appearing, toxic-appearing or diaphoretic.  Cardiovascular:     Rate and Rhythm: Normal rate and regular rhythm.     Heart sounds: Normal heart sounds. No murmur heard.   No friction rub. No gallop.  Pulmonary:     Effort: Pulmonary effort is normal. No respiratory distress.     Breath sounds: Normal breath sounds. No stridor. No wheezing, rhonchi or rales.  Chest:     Chest wall: No tenderness.   Neurological:     General: No focal deficit present.     Mental Status: He is alert and oriented to person, place, and time. Mental status is at baseline.  Psychiatric:        Mood and Affect: Mood normal.        Behavior: Behavior normal.        Thought Content: Thought content normal.        Judgment: Judgment normal.    BP 113/62  Pulse 91   Temp 98.2 F (36.8 C) (Temporal)   Resp 18   Ht 6\' 3"  (1.905 m)   Wt (!) 343 lb 3.2 oz (155.7 kg)   SpO2 96%   BMI 42.90 kg/m  Wt Readings from Last 3 Encounters:  02/04/21 (!) 343 lb 3.2 oz (155.7 kg)  01/31/21 (!) 345 lb 3.9 oz (156.6 kg)  10/26/20 (!) 368 lb (166.9 kg)     Health Maintenance Due  Topic Date Due   Hepatitis C Screening  Never done   COLONOSCOPY (Pts 45-71yrs Insurance coverage will need to be confirmed)  Never done   Zoster Vaccines- Shingrix (1 of 2) Never done   COVID-19 Vaccine (2 - Pfizer series) 07/08/2019    There are no preventive care reminders to display for this patient.  Lab Results  Component Value Date   TSH 0.542 01/28/2021   Lab Results  Component Value Date   WBC 19.1 (H) 01/31/2021   HGB 13.8 01/31/2021   HCT 42.0 01/31/2021   MCV 97.4 01/31/2021   PLT 363 01/31/2021   Lab Results  Component Value Date   NA 133 (L) 01/31/2021   K 4.3 01/31/2021   CO2 28 01/31/2021   GLUCOSE 221 (H) 01/31/2021   BUN 17 01/31/2021   CREATININE 1.00 01/31/2021   BILITOT 1.0 01/28/2021   ALKPHOS 124 01/28/2021   AST 48 (H) 01/28/2021   ALT 47 (H) 01/28/2021   PROT 7.1 01/28/2021   ALBUMIN 2.6 (L) 01/28/2021   CALCIUM 8.8 (L) 01/31/2021   ANIONGAP 10 01/31/2021   No results found for: CHOL No results found for: HDL No results found for: [facility] Lab Results  Component Value Date   TRIG 87 01/27/2021   No results found for: CHOLHDL Lab Results  Component Value Date   HGBA1C 6.5 (H) 01/29/2021      Assessment & Plan:   Problem List Items Addressed This Visit       Cardiovascular  and Mediastinum   Acute pulmonary embolism (HCC)   Relevant Orders   Ambulatory referral to Cardiology   Hypertension   Relevant Orders   Ambulatory referral to Cardiology   Other Visit Diagnoses     Seasonal allergic rhinitis, unspecified trigger    -  Primary   Relevant Medications   azelastine (ASTELIN) 0.1 % nasal spray   ipratropium (ATROVENT) 0.03 % nasal spray   Witnessed episode of apnea       Relevant Orders   Ambulatory referral to Neurology   Moderate persistent asthma without complication       Relevant Medications   fluticasone-salmeterol (ADVAIR) 250-50 MCG/ACT AEPB   montelukast (SINGULAIR) 10 MG tablet   Hospital discharge follow-up       Relevant Orders   CBC with Differential/Platelet   Comprehensive metabolic panel   Ambulatory referral to Cardiology       Meds ordered this encounter  Medications   azelastine (ASTELIN) 0.1 % nasal spray    Sig: Place 1 spray into both nostrils 2 (two) times daily. Use in each nostril as directed    Dispense:  30 mL    Refill:  12    Order Specific Question:   Supervising Provider    Answer:   01/31/2021, JEFFREY R [[id number]]   ipratropium (ATROVENT) 0.03 % nasal spray    Sig: Place 2 sprays into both nostrils daily as needed for rhinitis.    Dispense:  30 mL    Refill:  11  Order Specific Question:   Supervising Provider    Answer:   Neva Seat, JEFFREY R [[id number]]   fluticasone-salmeterol (ADVAIR) 250-50 MCG/ACT AEPB    Sig: Inhale 1 puff into the lungs 2 (two) times daily.    Dispense:  60 each    Refill:  11    Order Specific Question:   Supervising Provider    Answer:   Neva Seat, JEFFREY R [[id number]]   montelukast (SINGULAIR) 10 MG tablet    Sig: Take 1 tablet (10 mg total) by mouth daily.    Dispense:  90 tablet    Refill:  3    Order Specific Question:   Supervising Provider    Answer:   Neva Seat, JEFFREY R [[id number]]    Follow-up: Return in about 3 months (around 05/05/2021) for follow up and labs.   PLAN Continue on home  oxygen and discharge meds Refer for sleep study Refer to cardiology for follow up Return in 3 mo for repeat labs Labs collected. Will follow up with the patient as warranted. Patient encouraged to call clinic with any questions, comments, or concerns.  Janeece Agee, NP

## 2021-02-07 ENCOUNTER — Encounter: Payer: Self-pay | Admitting: Registered Nurse

## 2021-02-09 ENCOUNTER — Encounter: Payer: Self-pay | Admitting: Registered Nurse

## 2021-02-09 ENCOUNTER — Ambulatory Visit: Payer: Federal, State, Local not specified - PPO | Admitting: Registered Nurse

## 2021-02-09 VITALS — BP 132/78 | HR 106 | Temp 97.7°F | Resp 16 | Ht 75.0 in | Wt 338.9 lb

## 2021-02-09 DIAGNOSIS — J302 Other seasonal allergic rhinitis: Secondary | ICD-10-CM | POA: Diagnosis not present

## 2021-02-09 DIAGNOSIS — I2699 Other pulmonary embolism without acute cor pulmonale: Secondary | ICD-10-CM | POA: Diagnosis not present

## 2021-02-09 MED ORDER — AZELASTINE HCL 0.1 % NA SOLN: 1.0000 | Freq: Two times a day (BID) | NASAL | 12 refills | Status: DC

## 2021-02-09 MED ORDER — RIVAROXABAN 20 MG PO TABS: 20.0000 mg | ORAL_TABLET | Freq: Every day | ORAL | 0 refills | Status: DC

## 2021-02-09 NOTE — Patient Instructions (Signed)
Mr. Trier -   Randie Heinz to speak with you.  In brief:  Azelastine nasal spray should be available at [facility]  I have sent the remainder of the blood thinners you'll need to [facility]. If price is an issue, let me know and we can work with the manufacturer.  Ok to use oxygen less if you're feeling good and O2 sats aren't dropping much when active. A drop by a few percent points is ok, a drop by 10 is too much.   Max heart rate should be (220-age), so for you, around 155. If you're getting up to 110 on activity, that's okay. Once you're feeling up to exercising again, getting towards 120 - 125 would be fine. If it's going up and not coming back down within a few minutes of resting, might have to get the oxygen back on.   Let me know if you have any questions, always happy to answer   Thank you  Luan Pulling

## 2021-02-09 NOTE — Progress Notes (Signed)
Established Patient Office Visit  Subjective:  Patient ID: Matthew Oconnell, male    DOB: 05-24-1955  Age: 65 y.o. MRN: [id number]  CC:  Chief Complaint  Patient presents with   Medication Problem    Pt would like to discuss blood thinner /treatment, also needs Azestaline nasal spray sent to different pharmacy due to no stock at [facility] (pended)    HPI Matthew Oconnell presents for medication problem  Azelastine nasal spray for pnd and allergies: out of stock at [facility]. Would like this sent to [facility] instead. Has not yet used to unsure of benefit.  Wants to revisit blood thinner treatment.  Noted to have PE upon presentation to hospital Has been on xarelto 20mg   Tolerating well without AE.  Needs refill for continuing months  He has appt tomorrow with pulmonary Has upcoming appt with sleep med  Past Medical History:  Diagnosis Date   Asthmatic bronchitis with acute exacerbation    Cellulitis    Ganglion cyst    Of Left Hand   Gout    Hypertension    Hyperuricemia    Leg swelling    Mild depression    Mild persistent asthma    Morbid obesity (HCC)    Osteoarthritis    Prediabetes    Recurrent sinusitis    Redness    Bilateral lower extremities   Stasis dermatitis of both legs    Venous insufficiency     Past Surgical History:  Procedure Laterality Date   COLONOSCOPY N/A 05/04/2008   ganglion cyst removed from L hand Left    Right hand repair for a compound break Right     History reviewed. No pertinent family history.  Social History   Socioeconomic History   Marital status: Widowed    Spouse name: Not on file   Number of children: Not on file   Years of education: Not on file   Highest education level: Not on file  Occupational History   Not on file  Tobacco Use   Smoking status: Former    Types: Cigarettes    Quit date: 2015    Years since quitting: 7.9   Smokeless tobacco: Never  Vaping Use   Vaping Use: Never used  Substance and  Sexual Activity   Alcohol use: Yes    Comment: occ   Drug use: Not Currently   Sexual activity: Not on file  Other Topics Concern   Not on file  Social History Narrative   Not on file   Social Determinants of Health   Financial Resource Strain: Not on file  Food Insecurity: Not on file  Transportation Needs: Not on file  Physical Activity: Not on file  Stress: Not on file  Social Connections: Not on file  Intimate Partner Violence: Not on file    Outpatient Medications Prior to Visit  Medication Sig Dispense Refill   acetaminophen (TYLENOL) 500 MG tablet Take 1,000 mg by mouth every 6 (six) hours as needed for moderate pain, headache or fever.     fexofenadine (ALLEGRA) 180 MG tablet Take 180 mg by mouth daily as needed for allergies or rhinitis.     fluticasone (FLONASE) 50 MCG/ACT nasal spray 2 sprays daily as needed for allergies.     fluticasone-salmeterol (ADVAIR) 250-50 MCG/ACT AEPB Inhale 1 puff into the lungs 2 (two) times daily. 60 each 11   furosemide (LASIX) 20 MG tablet Take 20 mg by mouth 2 (two) times daily.     ipratropium (ATROVENT)  0.03 % nasal spray Place 2 sprays into both nostrils daily as needed for rhinitis. 30 mL 11   lisinopril (ZESTRIL) 10 MG tablet Take 10 mg by mouth daily.     montelukast (SINGULAIR) 10 MG tablet Take 1 tablet (10 mg total) by mouth daily. 90 tablet 3   RIVAROXABAN (XARELTO) VTE STARTER PACK (15 & 20 MG) Take one 15mg  tablet by mouth twice a day until 02/17/21. On 02/18/21, switch to one 20mg  tablet once a day. Take with food. 51 each 2   azelastine (ASTELIN) 0.1 % nasal spray Place 1 spray into both nostrils 2 (two) times daily. Use in each nostril as directed 30 mL 12   No facility-administered medications prior to visit.    Allergies  Allergen Reactions   Cefuroxime Rash   Sulfamethoxazole-Trimethoprim Rash    ROS Review of Systems  Constitutional: Negative.   HENT: Negative.    Eyes: Negative.   Respiratory: Negative.     Cardiovascular: Negative.   Gastrointestinal: Negative.   Genitourinary: Negative.   Musculoskeletal: Negative.   Skin: Negative.   Neurological: Negative.   Psychiatric/Behavioral: Negative.    All other systems reviewed and are negative.    Objective:    Physical Exam Constitutional:      General: He is not in acute distress.    Appearance: Normal appearance. He is normal weight. He is not ill-appearing, toxic-appearing or diaphoretic.  Cardiovascular:     Rate and Rhythm: Normal rate and regular rhythm.     Heart sounds: Normal heart sounds. No murmur heard.   No friction rub. No gallop.  Pulmonary:     Effort: Pulmonary effort is normal. No respiratory distress.     Breath sounds: Normal breath sounds. No stridor. No wheezing, rhonchi or rales.  Chest:     Chest wall: No tenderness.  Neurological:     General: No focal deficit present.     Mental Status: He is alert and oriented to person, place, and time. Mental status is at baseline.  Psychiatric:        Mood and Affect: Mood normal.        Behavior: Behavior normal.        Thought Content: Thought content normal.        Judgment: Judgment normal.    BP 132/78   Pulse (!) 106   Temp 97.7 F (36.5 C) (Temporal)   Resp 16   Ht 6\' 3"  (1.905 m)   Wt (!) 338 lb 14.4 oz (153.7 kg)   SpO2 94%   BMI 42.36 kg/m  Wt Readings from Last 3 Encounters:  02/09/21 (!) 338 lb 14.4 oz (153.7 kg)  02/04/21 (!) 343 lb 3.2 oz (155.7 kg)  01/31/21 (!) 345 lb 3.9 oz (156.6 kg)     Health Maintenance Due  Topic Date Due   Hepatitis C Screening  Never done   COLONOSCOPY (Pts 45-47yrs Insurance coverage will need to be confirmed)  Never done   Zoster Vaccines- Shingrix (1 of 2) Never done   COVID-19 Vaccine (2 - Pfizer series) 07/08/2019    There are no preventive care reminders to display for this patient.  Lab Results  Component Value Date   TSH 0.542 01/28/2021   Lab Results  Component Value Date   WBC 13.4 (H)  02/04/2021   HGB 15.4 02/04/2021   HCT 45.9 02/04/2021   MCV 96.3 02/04/2021   PLT 405.0 (H) 02/04/2021   Lab Results  Component Value Date  NA 133 (L) 02/04/2021   K 4.3 02/04/2021   CO2 29 02/04/2021   GLUCOSE 162 (H) 02/04/2021   BUN 15 02/04/2021   CREATININE 1.05 02/04/2021   BILITOT 0.7 02/04/2021   ALKPHOS 79 02/04/2021   AST 24 02/04/2021   ALT 40 02/04/2021   PROT 7.4 02/04/2021   ALBUMIN 3.5 02/04/2021   CALCIUM 9.6 02/04/2021   ANIONGAP 10 01/31/2021   GFR 74.54 02/04/2021   No results found for: CHOL No results found for: HDL No results found for: [facility] Lab Results  Component Value Date   TRIG 87 01/27/2021   No results found for: CHOLHDL Lab Results  Component Value Date   HGBA1C 6.5 (H) 01/29/2021      Assessment & Plan:   Problem List Items Addressed This Visit       Cardiovascular and Mediastinum   Acute pulmonary embolism (HCC) - Primary   Relevant Medications   rivaroxaban (XARELTO) 20 MG TABS tablet   Other Visit Diagnoses     Seasonal allergic rhinitis, unspecified trigger       Relevant Medications   azelastine (ASTELIN) 0.1 % nasal spray       Meds ordered this encounter  Medications   azelastine (ASTELIN) 0.1 % nasal spray    Sig: Place 1 spray into both nostrils 2 (two) times daily. Use in each nostril as directed    Dispense:  30 mL    Refill:  12   rivaroxaban (XARELTO) 20 MG TABS tablet    Sig: Take 1 tablet (20 mg total) by mouth daily with supper.    Dispense:  90 tablet    Refill:  0    Order Specific Question:   Supervising Provider    Answer:   Neva Seat, JEFFREY R [[id number]]    Follow-up: Return if symptoms worsen or fail to improve, for or as scheduled in March .   PLAN Continues to improve appropriately. Encourage mild increases in activity as tolerated. Ok to use oxygen a bit less.  Follow up with specialists as scheduled. Remainder of xarelto called in Azelastine sent to [facility] Patient encouraged to call  clinic with any questions, comments, or concerns.  Matthew Agee, NP

## 2021-02-10 ENCOUNTER — Other Ambulatory Visit: Payer: Self-pay

## 2021-02-10 ENCOUNTER — Ambulatory Visit: Payer: Medicare Other | Admitting: Pulmonary Disease

## 2021-02-10 ENCOUNTER — Encounter: Payer: Self-pay | Admitting: Pulmonary Disease

## 2021-02-10 VITALS — BP 116/72 | HR 94 | Ht 75.0 in | Wt 340.0 lb

## 2021-02-10 DIAGNOSIS — J4540 Moderate persistent asthma, uncomplicated: Secondary | ICD-10-CM | POA: Diagnosis not present

## 2021-02-10 DIAGNOSIS — J189 Pneumonia, unspecified organism: Secondary | ICD-10-CM | POA: Diagnosis not present

## 2021-02-10 DIAGNOSIS — I2609 Other pulmonary embolism with acute cor pulmonale: Secondary | ICD-10-CM | POA: Diagnosis not present

## 2021-02-10 NOTE — Discharge Summary (Signed)
Physician Discharge Summary  Matthew Oconnell:[id number] DOB: Oct 30, 1955 DOA: 01/27/2021  PCP: Janeece Agee, NP  Admit date: 01/27/2021 Discharge date: 01/31/2021  Time spent:  Recommendations for Outpatient Follow-up:  PCP in 1 week, please review imaging studies from this admission, follow-up hemoglobin A1c Follow-up chest x-ray in 4 to 6 weeks Referral sent to pulmonary for follow-up, needs sleep study   Discharge Diagnoses:  Principal Problem:   Severe sepsis (HCC) Active Problems:   CAP (community acquired pneumonia)   SOB (shortness of breath)   Acute pulmonary embolism (HCC)   Acute asthma exacerbation   Hyponatremia   Hypertension Hyperglycemia Morbid obesity  Discharge Condition: Stable  Diet recommendation: Low-sodium  Filed Weights   01/29/21 0547 01/30/21 0600 01/31/21 0440  Weight: (!) 158.9 kg (!) 158.5 kg (!) 156.6 kg    History of present illness:  65/M with history of asthma, hypertension, chronic venous insufficiency, gout, leg swelling, obesity presented to the ED with progressive shortness of breath X 1 week. -Also reported cough, subjective fevers, congestion and wheezing.  Recent long drive to [place] 3 to 4 weeks prior -In the emergency room he was noted to be mildly hypoxic, tachypneic and tachycardic, CT chest was concerning for acute left upper lobe pulmonary embolism with dilation of right ventricle, and evidence of bilateral airspace opacities concerning for pneumonia   Hospital Course:   Severe sepsis -poa CAP (community acquired pneumonia) Acute asthma/COPD exacerbation Acute hypoxic respiratory failure -Clinically improving, completed 5-day course of Levaquin -Improved air movement and no further wheezing at this time, transitioned to oral prednisone for quick taper -Blood cultures are negative -COVID PCR and influenza were negative -Weaned off oxygen, advised follow-up with PCP in 1 week   Acute pulmonary  embolism -Likely triggered by morbid obesity, sedentary lifestyle and recent long drive to [place] -Treated with IV heparin initially, transitioned to oral Xarelto 11/25 -2D echo noted some RV dilation and decreased RV function, likely secondary acute PE and undiagnosed OSA, will need sleep study as outpatient  -TOC consult for sleep study   Chronic lower extremity edema Venous insufficiency -No evidence of CHF on echo, has been taking Lasix twice daily for few months -needs sleep study -Lasix resumed at discharge, was euvolemic this admission  Morbid obesity -BMI is 44   Hyperglycemia -Likely worsened by steroids, hemoglobin A1c is still pending at discharge  Discharge Exam: Vitals:   01/31/21 0930 01/31/21 1140  BP:  127/70  Pulse:  (!) 122  Resp:  (!) 24  Temp:  98.5 F (36.9 C)  SpO2: (!) 88% 94%   General exam: Obese pleasant male sitting up in bed, AAOx3, no distress HEENT: Neck obese unable to assess JVD CVS: S1-S2, regular rate rhythm Lungs: Improved air movement, no expiratory wheezes few basilar rhonchi Abdomen: Soft, nontender, bowel sounds present Extremities: Trace edema, chronic venous stasis changes Psychiatry: Judgement and insight appear normal. Mood & affect appropriate  Discharge Instructions   Discharge Instructions     Ambulatory referral to Pulmonology   Complete by: As directed    Reason for referral: Asthma/COPD   Diet - low sodium heart healthy   Complete by: As directed    Diet Carb Modified   Complete by: As directed    Increase activity slowly   Complete by: As directed       Allergies as of 01/31/2021       Reactions   Cefuroxime Rash   Sulfamethoxazole-trimethoprim Rash  Medication List     STOP taking these medications    MUCINEX CNG/CGH/COLD/FLU DY/NT PO       TAKE these medications    acetaminophen 500 MG tablet Commonly known as: TYLENOL Take 1,000 mg by mouth every 6 (six) hours as needed for  moderate pain, headache or fever.   fexofenadine 180 MG tablet Commonly known as: ALLEGRA Take 180 mg by mouth daily as needed for allergies or rhinitis.   fluticasone 50 MCG/ACT nasal spray Commonly known as: FLONASE 2 sprays daily as needed for allergies.   furosemide 20 MG tablet Commonly known as: LASIX Take 20 mg by mouth 2 (two) times daily.   lisinopril 10 MG tablet Commonly known as: ZESTRIL Take 10 mg by mouth daily.   Xarelto Starter Pack Generic drug: Rivaroxaban Stater Pack (15 mg and 20 mg) Take one 15mg  tablet by mouth twice a day until 02/17/21. On 02/18/21, switch to one 20mg  tablet once a day. Take with food.       ASK your doctor about these medications    levofloxacin 750 MG tablet Commonly known as: LEVAQUIN Take 1 tablet (750 mg total) by mouth daily at 6 PM for 2 days. Ask about: Should I take this medication?   predniSONE 20 MG tablet Commonly known as: DELTASONE Take 1 tablet (20 mg total) by mouth daily with breakfast for 3 days. Ask about: Should I take this medication?       Allergies  Allergen Reactions   Cefuroxime Rash   Sulfamethoxazole-Trimethoprim Rash    Follow-up Information     PCP Follow up in 1 week(s).   Why: Need repeat Hba1c in 1-61months and Sleep study        [facility] Follow up.   Why: oxygen Contact information: [address] [place] [address]month [phone]                  The results of significant diagnostics from this hospitalization (including imaging, microbiology, ancillary and laboratory) are listed below for reference.    Significant Diagnostic Studies: CT Angio Chest PE W and/or Wo Contrast  Addendum Date: 01/27/2021   ADDENDUM REPORT: 01/27/2021 21:43 ADDENDUM: Findings were reported to Dr. 01/29/2021 at 9:41 p.m. Electronically Signed   By: 01/29/2021 M.D.   On: 01/27/2021 21:43   Result Date: 01/27/2021 CLINICAL DATA:  Positive D-dimer, PE suspected,  low/intermediate probability. EXAM: CT ANGIOGRAPHY CHEST WITH CONTRAST TECHNIQUE: Multidetector CT imaging of the chest was performed using the standard protocol during bolus administration of intravenous contrast. Multiplanar CT image reconstructions and MIPs were obtained to evaluate the vascular anatomy. CONTRAST:  01/29/2021 OMNIPAQUE IOHEXOL 350 MG/ML SOLN COMPARISON:  01/27/2021. FINDINGS: Cardiovascular: The heart is enlarged and there is no pericardial effusion. Coronary artery calcifications are noted. There is no evidence of aortic aneurysm. The pulmonary trunk is distended suggesting underlying pulmonary artery hypertension. No large saddle embolus is identified. A filling defect is noted in a left upper lobe lobar pulmonary artery. Evaluation of the segmental and subsegmental arteries is limited due to suboptimal opacification, mixing artifact and respiratory motion. There is dilatation of the right ventricle, which may be associated with right heart strain. Mediastinum/Nodes: Shotty lymph nodes are present in the mediastinum and hilar regions bilaterally. No axillary lymphadenopathy. The trachea and esophagus are within normal limits. Lungs/Pleura: Scattered opacities are noted in the lungs bilaterally and there is consolidation in the left lower lobe. No effusion or pneumothorax. Upper Abdomen: No acute abnormality.  Musculoskeletal: Gynecomastia is noted. Degenerative changes are present in the thoracic spine. Review of the MIP images confirms the above findings. IMPRESSION: 1. Left upper lobe lobar pulmonary embolus. There is dilatation of the right ventricle which may be associated with underlying right heart strain. Evaluation of the distal segmental and subsegmental arteries is limited due to suboptimal opacification, mixing artifact, and respiratory motion. 2. Patchy opacities in the lungs bilaterally, which may represent infectious or inflammatory pneumonitis. 3. Cardiomegaly with coronary artery  calcifications. There is dilatation of the pulmonary trunk suggesting underlying pulmonary artery hypertension. Electronically Signed: By: Thornell Sartorius M.D. On: 01/27/2021 21:38   DG Chest Portable 1 View  Result Date: 01/27/2021 CLINICAL DATA:  Short of breath, COVID-19 positive EXAM: PORTABLE CHEST 1 VIEW COMPARISON:  07/27/2011 FINDINGS: Single frontal view of the chest demonstrates a stable cardiac silhouette. There is basilar predominant interstitial and ground-glass opacity, which could reflect atypical pneumonia given history of COVID 19 positive. Edema could give a similar appearance. No effusion or pneumothorax. No acute bony abnormalities. IMPRESSION: 1. Basilar predominant interstitial and ground-glass opacities, which could reflect COVID-19 pneumonia given clinical presentation. Electronically Signed   By: Sharlet Salina M.D.   On: 01/27/2021 18:39   ECHOCARDIOGRAM COMPLETE  Result Date: 01/28/2021    ECHOCARDIOGRAM REPORT   Patient Name:   SWAN ZAYED Date of Exam: 01/28/2021 Medical Rec #:  [id number]        Height:       75.0 in Accession #:    [id number]       Weight:       355.2 lb Date of Birth:  06/16/1955         BSA:          2.801 m Patient Age:    65 years         BP:           152/82 mmHg Patient Gender: M                HR:           96 bpm. Exam Location:  Inpatient Procedure: 2D Echo and Intracardiac Opacification Agent Indications:    pulmonary embolus  History:        Patient has no prior history of Echocardiogram examinations.                 Pneumonia. sepsis., Signs/Symptoms:Dyspnea; Risk                 Factors:Hypertension.  Sonographer:    Delcie Roch RDCS Referring Phys: [id number] JUSTIN B HOWERTER IMPRESSIONS  1. Left ventricular ejection fraction, by estimation, is 60 to 65%. The left ventricle has normal function. The left ventricle has no regional wall motion abnormalities. Left ventricular diastolic parameters were normal.  2. Right ventricular systolic  function is moderately reduced. The right ventricular size is moderately enlarged.  3. Left atrial size was mildly dilated.  4. The mitral valve is normal in structure. No evidence of mitral valve regurgitation. No evidence of mitral stenosis.  5. The aortic valve is tricuspid. Aortic valve regurgitation is not visualized. No aortic stenosis is present.  6. Aortic dilatation noted. There is mild dilatation of the ascending aorta, measuring 38 mm.  7. The inferior vena cava is dilated in size with >50% respiratory variability, suggesting right atrial pressure of 8 mmHg. FINDINGS  Left Ventricle: Left ventricular ejection fraction, by estimation, is 60 to 65%. The left ventricle has normal function.  The left ventricle has no regional wall motion abnormalities. Definity contrast agent was given IV to delineate the left ventricular  endocardial borders. The left ventricular internal cavity size was normal in size. There is no left ventricular hypertrophy. Left ventricular diastolic parameters were normal. Right Ventricle: The right ventricular size is moderately enlarged. No increase in right ventricular wall thickness. Right ventricular systolic function is moderately reduced. Left Atrium: Left atrial size was mildly dilated. Right Atrium: Right atrial size was normal in size. Pericardium: There is no evidence of pericardial effusion. Mitral Valve: The mitral valve is normal in structure. There is mild thickening of the mitral valve leaflet(s). There is moderate calcification of the mitral valve leaflet(s). No evidence of mitral valve regurgitation. No evidence of mitral valve stenosis. Tricuspid Valve: The tricuspid valve is normal in structure. Tricuspid valve regurgitation is not demonstrated. No evidence of tricuspid stenosis. Aortic Valve: The aortic valve is tricuspid. Aortic valve regurgitation is not visualized. No aortic stenosis is present. Pulmonic Valve: The pulmonic valve was normal in structure. Pulmonic  valve regurgitation is not visualized. No evidence of pulmonic stenosis. Aorta: The aortic root is normal in size and structure and aortic dilatation noted. There is mild dilatation of the ascending aorta, measuring 38 mm. Venous: The inferior vena cava is dilated in size with greater than 50% respiratory variability, suggesting right atrial pressure of 8 mmHg. IAS/Shunts: No atrial level shunt detected by color flow Doppler.  LEFT VENTRICLE PLAX 2D LVIDd:         5.30 cm   Diastology LVIDs:         3.70 cm   LV e' lateral: 12.00 cm/s LV PW:         1.20 cm LV IVS:        1.40 cm LVOT diam:     2.20 cm LV SV:         82 LV SV Index:   29 LVOT Area:     3.80 cm  RIGHT VENTRICLE             IVC RV S prime:     17.10 cm/s  IVC diam: 2.20 cm TAPSE (M-mode): 3.1 cm LEFT ATRIUM              Index        RIGHT ATRIUM           Index LA diam:        4.30 cm  1.54 cm/m   RA Area:     21.40 cm LA Vol (A2C):   80.2 ml  28.63 ml/m  RA Volume:   62.60 ml  22.35 ml/m LA Vol (A4C):   102.0 ml 36.42 ml/m LA Biplane Vol: 99.2 ml  35.42 ml/m  AORTIC VALVE LVOT Vmax:   134.00 cm/s LVOT Vmean:  84.900 cm/s LVOT VTI:    0.217 m  AORTA Ao Root diam: 3.60 cm Ao Asc diam:  3.80 cm  SHUNTS Systemic VTI:  0.22 m Systemic Diam: 2.20 cm Charlton Haws MD Electronically signed by Charlton Haws MD Signature Date/Time: 01/28/2021/4:33:14 PM    Final     Microbiology: No results found for this or any previous visit (from the past 240 hour(s)).   Labs: Basic Metabolic Panel: Recent Labs  Lab 02/04/21 1032  NA 133*  K 4.3  CL 95*  CO2 29  GLUCOSE 162*  BUN 15  CREATININE 1.05  CALCIUM 9.6   Liver Function Tests: Recent Labs  Lab 02/04/21 1032  AST 24  ALT 40  ALKPHOS 79  BILITOT 0.7  PROT 7.4  ALBUMIN 3.5   No results for input(s): LIPASE, AMYLASE in the last 168 hours. No results for input(s): AMMONIA in the last 168 hours. CBC: Recent Labs  Lab 02/04/21 1032  WBC 13.4*  NEUTROABS 10.7*  HGB 15.4  HCT 45.9   MCV 96.3  PLT 405.0*   Cardiac Enzymes: No results for input(s): CKTOTAL, CKMB, CKMBINDEX, TROPONINI in the last 168 hours. BNP: BNP (last 3 results) Recent Labs    01/28/21 0648  BNP 58.0    ProBNP (last 3 results) No results for input(s): PROBNP in the last 8760 hours.  CBG: No results for input(s): GLUCAP in the last 168 hours.     Signed:  Zannie Cove MD.  [facility] 02/10/2021, 3:29 PM

## 2021-02-10 NOTE — Progress Notes (Signed)
Synopsis: Referred in December 2022 for ashtma and pulmonary emboli by Zannie Cove, MD  Subjective:   PATIENT ID: Matthew Oconnell GENDER: male DOB: December 10, 1955, MRN: [id number]  HPI  Chief Complaint  Patient presents with   Consult    F/U from hospital visit back in November. Diagnosed with a PE and PNA. Was discharged on 2L of O2. States he has been doing well since being home.    Matthew Oconnell is a 65 year old male, former smoker with hypertension and asthma who is referred to pulmonary clinic for hospital follow up of pneumonia, asthma exacerbation and pulmonary emboli.   Patient was admitted 11/24-11/28 and was discharged on 2 L of supplemental oxygen.  He reports significant improvement since hospitalization with improvement in his shortness of breath.  He is currently taking Xarelto for the pulmonary embolus.  CTA chest 11/24 was notable for left upper lobe lobar filling defect concerning for pulmonary embolus.  There was also a left lower lobe opacity concerning for pneumonia.  The pulmonary artery trunk was enlarged concerning for pulmonary arterial hypertension.  Echo showed normal LVEF with moderately enlarged RV with moderate decrease in RV function.  Patient reports prolonged car travel from [place] a couple weeks to 1 month prior to his admission.  No prior history of blood clots.  No family history of blood clots.  He reports history of asthma in which she was diagnosed 3 to 4 years ago.  He was using Advair discus inhaler once daily but has been using it twice daily since hospitalization.  He has as needed albuterol but rarely uses this.  He has lost 37 pounds since August as he has been working on weight loss with diet changes and portion control.  He does report significant seasonal allergies.  He does report history of bilateral lower extremity edema which is improved since hospitalization.  He is a former smoker and quit in 2015.  He retired this past September  from the [employer].  He was previously in the [employer] for 17 years.  His wife recently passed away last month from sepsis.  Past Medical History:  Diagnosis Date   Asthmatic bronchitis with acute exacerbation    Cellulitis    Ganglion cyst    Of Left Hand   Gout    Hypertension    Hyperuricemia    Leg swelling    Mild depression    Mild persistent asthma    Morbid obesity (HCC)    Osteoarthritis    Prediabetes    Recurrent sinusitis    Redness    Bilateral lower extremities   Stasis dermatitis of both legs    Venous insufficiency      No family history on file.   Social History   Socioeconomic History   Marital status: Widowed    Spouse name: Not on file   Number of children: Not on file   Years of education: Not on file   Highest education level: Not on file  Occupational History   Not on file  Tobacco Use   Smoking status: Former    Types: Cigarettes    Quit date: 2015    Years since quitting: 7.9   Smokeless tobacco: Never  Vaping Use   Vaping Use: Never used  Substance and Sexual Activity   Alcohol use: Yes    Comment: occ   Drug use: Not Currently   Sexual activity: Not on file  Other Topics Concern   Not on file  Social History Narrative   Not on file   Social Determinants of Health   Financial Resource Strain: Not on file  Food Insecurity: Not on file  Transportation Needs: Not on file  Physical Activity: Not on file  Stress: Not on file  Social Connections: Not on file  Intimate Partner Violence: Not on file     Allergies  Allergen Reactions   Cefuroxime Rash   Sulfamethoxazole-Trimethoprim Rash     Outpatient Medications Prior to Visit  Medication Sig Dispense Refill   acetaminophen (TYLENOL) 500 MG tablet Take 1,000 mg by mouth every 6 (six) hours as needed for moderate pain, headache or fever.     azelastine (ASTELIN) 0.1 % nasal spray Place 1 spray into both nostrils 2 (two) times daily. Use in each nostril as directed 30 mL 12    fexofenadine (ALLEGRA) 180 MG tablet Take 180 mg by mouth daily as needed for allergies or rhinitis.     fluticasone (FLONASE) 50 MCG/ACT nasal spray 2 sprays daily as needed for allergies.     fluticasone-salmeterol (ADVAIR) 250-50 MCG/ACT AEPB Inhale 1 puff into the lungs 2 (two) times daily. 60 each 11   furosemide (LASIX) 20 MG tablet Take 20 mg by mouth 2 (two) times daily.     ipratropium (ATROVENT) 0.03 % nasal spray Place 2 sprays into both nostrils daily as needed for rhinitis. 30 mL 11   lisinopril (ZESTRIL) 10 MG tablet Take 10 mg by mouth daily.     montelukast (SINGULAIR) 10 MG tablet Take 1 tablet (10 mg total) by mouth daily. 90 tablet 3   rivaroxaban (XARELTO) 20 MG TABS tablet Take 1 tablet (20 mg total) by mouth daily with supper. 90 tablet 0   RIVAROXABAN (XARELTO) VTE STARTER PACK (15 & 20 MG) Take one  tablet by mouth twice a day until 02/17/21. On 02/18/21, switch to one  tablet once a day. Take with food. 51 each 2   No facility-administered medications prior to visit.   Review of Systems  Constitutional:  Positive for weight loss. Negative for chills, fever and malaise/fatigue.  HENT:  Positive for congestion. Negative for sinus pain and sore throat.   Eyes: Negative.   Respiratory:  Positive for shortness of breath. Negative for cough, hemoptysis, sputum production and wheezing.   Cardiovascular:  Positive for leg swelling. Negative for chest pain, palpitations, orthopnea and claudication.  Gastrointestinal:  Negative for abdominal pain, heartburn, nausea and vomiting.  Genitourinary: Negative.   Musculoskeletal:  Negative for joint pain and myalgias.  Skin:  Negative for rash.  Neurological:  Negative for weakness.  Endo/Heme/Allergies: Negative.   Psychiatric/Behavioral: Negative.     Objective:   Vitals:   02/10/21 1052  BP: 116/72  Pulse: 94  SpO2: 97%  Weight: (!) 340 lb (154.2 kg)  Height:  (1.905 m)   Physical Exam Constitutional:       General: He is not in acute distress.    Appearance: He is obese.  HENT:     Head: Normocephalic and atraumatic.  Eyes:     Extraocular Movements: Extraocular movements intact.     Conjunctiva/sclera: Conjunctivae normal.     Pupils: Pupils are equal, round, and reactive to light.  Cardiovascular:     Rate and Rhythm: Normal rate and regular rhythm.     Pulses: Normal pulses.     Heart sounds: Normal heart sounds. No murmur heard. Pulmonary:     Effort: Pulmonary effort is normal.  Breath sounds: Normal breath sounds.  Abdominal:     General: Bowel sounds are normal.     Palpations: Abdomen is soft.  Musculoskeletal:     Right lower leg: No edema.     Left lower leg: No edema.  Lymphadenopathy:     Cervical: No cervical adenopathy.  Skin:    General: Skin is warm and dry.  Neurological:     General: No focal deficit present.     Mental Status: He is alert.  Psychiatric:        Mood and Affect: Mood normal.        Behavior: Behavior normal.        Thought Content: Thought content normal.        Judgment: Judgment normal.   CBC    Component Value Date/Time   WBC 13.4 (H) 02/04/2021 1032   RBC 4.77 02/04/2021 1032   HGB 15.4 02/04/2021 1032   HCT 45.9 02/04/2021 1032   PLT 405.0 (H) 02/04/2021 1032   MCV 96.3 02/04/2021 1032   MCH 32.0 01/31/2021 0053   MCHC 33.5 02/04/2021 1032   RDW 13.5 02/04/2021 1032   LYMPHSABS 1.7 02/04/2021 1032   MONOABS 0.9 02/04/2021 1032   EOSABS 0.1 02/04/2021 1032   BASOSABS 0.1 02/04/2021 1032   BMP Latest Ref Rng & Units 02/04/2021 01/31/2021 01/30/2021  Glucose 70 - 99 mg/dL 993(Z) 169(C) 789(F)  BUN 6 - 23 mg/dL 15 17 18   Creatinine 0.40 - 1.50 mg/dL 8.10 1.75  Sodium 135 - 145 mEq/L 133(L) 133(L) 133(L)  Potassium 3.5 - 5.1 mEq/L 4.3 4.3 4.3  Chloride 96 - 112 mEq/L 95(L) 95(L) 98  CO2 19 - 32 mEq/L 29 28 28   Calcium 8.4 - 10.5 mg/dL 9.6 1.02) )   Chest imaging: CTA Chest 01/27/21 1. Left upper lobe lobar  pulmonary embolus. There is dilatation of the right ventricle which may be associated with underlying right heart strain. Evaluation of the distal segmental and subsegmental arteries is limited due to suboptimal opacification, mixing artifact, and respiratory motion. 2. Patchy opacities in the lungs bilaterally, which may represent infectious or inflammatory pneumonitis. 3. Cardiomegaly with coronary artery calcifications. There is dilatation of the pulmonary trunk suggesting underlying pulmonary artery hypertension.  PFT: No flowsheet data found.  Labs:  Path:  Echo 01/28/21: LVEF 60 to 65%.  LV normal function.  LV diastolic parameters normal.  RV systolic function moderately reduced with moderate enlargement.  LA size is mildly dilated.  Heart Catheterization:  Assessment & Plan:   Community acquired pneumonia of left lower lobe of lung  Acute pulmonary embolism with acute cor pulmonale, unspecified pulmonary embolism type (HCC)  Moderate persistent asthma without complication  Discussion: Sal Spratley is a 65 year old male, former smoker with hypertension and asthma who is referred to pulmonary clinic for hospital follow up of pneumonia, asthma exacerbation and pulmonary emboli.   Patient has been doing well since hospitalization.  He is to continue Advair 250-50 MCG 1 puff twice daily for his history of asthma.  He can continue albuterol as needed.  He is to continue Xarelto for the acute pulmonary embolism which was provoked by prolonged car travel and acute pneumonia.  We will plan to treat for 6 months.  It is difficult to determine if the echo results with the right heart dysfunction and dilation if they are from the acute pulmonary embolus or from possible history of untreated obstructive sleep apnea and nocturnal hypoxemia.  He has follow-up  in the near future at [facility] for evaluation of sleep apnea.  On simple walk in clinic today he  did not desaturate below 88% so he is safe to move around his home without oxygen when when needed.  We will continue supplemental oxygen therapy over the next month and then determine further necessity for therapy.  He appears committed and motivated to work on weight loss.  He is already lost 37 pounds.  He is safe to increase physical activity as tolerated.  Follow-up in 1 month.  Melody Comas, MD [facility] Office: [phone]    Current Outpatient Medications:    acetaminophen (TYLENOL) 500 MG tablet, Take 1,000 mg by mouth every 6 (six) hours as needed for moderate pain, headache or fever., Disp: , Rfl:    azelastine (ASTELIN) 0.1 % nasal spray, Place 1 spray into both nostrils 2 (two) times daily. Use in each nostril as directed, Disp: 30 mL, Rfl: 12   fexofenadine (ALLEGRA) 180 MG tablet, Take 180 mg by mouth daily as needed for allergies or rhinitis., Disp: , Rfl:    fluticasone (FLONASE) 50 MCG/ACT nasal spray, 2 sprays daily as needed for allergies., Disp: , Rfl:    fluticasone-salmeterol (ADVAIR) 250-50 MCG/ACT AEPB, Inhale 1 puff into the lungs 2 (two) times daily., Disp: 60 each, Rfl: 11   furosemide (LASIX) 20 MG tablet, Take 20 mg by mouth 2 (two) times daily., Disp: , Rfl:    ipratropium (ATROVENT) 0.03 % nasal spray, Place 2 sprays into both nostrils daily as needed for rhinitis., Disp: 30 mL, Rfl: 11   lisinopril (ZESTRIL) 10 MG tablet, Take 10 mg by mouth daily., Disp: , Rfl:    montelukast (SINGULAIR) 10 MG tablet, Take 1 tablet (10 mg total) by mouth daily., Disp: 90 tablet, Rfl: 3   rivaroxaban (XARELTO) 20 MG TABS tablet, Take 1 tablet (20 mg total) by mouth daily with supper., Disp: 90 tablet, Rfl: 0   RIVAROXABAN (XARELTO) VTE STARTER PACK (15 & 20 MG), Take one 15mg  tablet by mouth twice a day until 02/17/21. On 02/18/21, switch to one 20mg  tablet once a day. Take with food., Disp: 51 each, Rfl: 2

## 2021-02-10 NOTE — Patient Instructions (Addendum)
Continue on Xarelto daily for the blood clot, we will plan to treat for 6 months  Continue advair inhaler twice daily for asthma - rinse mouth after each use  Use albuterol 1-2 puffs every 4-6 hours as needed for shortness of breath, wheezing, cough or chest tightness  You are safe to increase your physical activity as tolerated with simple walks or your stationary pedaling device.   Follow up in 1 month to check on need for supplemental oxygen

## 2021-02-11 ENCOUNTER — Telehealth: Payer: Self-pay | Admitting: Pharmacist

## 2021-02-11 NOTE — Telephone Encounter (Signed)
Pharmacy Transitions of Care Follow-up Telephone Call  Date of discharge: 01/31/21  Discharge Diagnosis: PE and CAP  How have you been since you were released from the hospital? Patient is doing well since discharge.   Medication changes made at discharge:  - START: Xarelto Starter pack  - STOPPED:   - CHANGED:   Medication changes verified by the patient? Yes    Medication Accessibility:  Home Pharmacy: [facility] on [address] in [place]   Was the patient provided with refills on discharged medications? yes   Have all prescriptions been transferred from [facility] to home pharmacy? No, Patient received new prescription for Xarelto 20mg  at follow up appointment.   Is the patient able to afford medications? N/a Notable copays: n/a Eligible patient assistance: n/a    Medication Review: RIVAROXABAN (XARELTO)  Rivaroxaban 15 mg BID initiated on 01/28/21. Will switch to 20 mg daily after 21 days (DATE12/16/22).   - Discussed importance of taking medication with food and around the same time everyday  - Reviewed potential DDIs with patient  - Advised patient of medications to avoid (NSAIDs, ASA)  - Educated that Tylenol (acetaminophen) will be the preferred analgesic to prevent risk of bleeding  - Emphasized importance of monitoring for signs and symptoms of bleeding (abnormal bruising, prolonged bleeding, nose bleeds, bleeding from gums, discolored urine, black tarry stools)  - Advised patient to alert all providers of anticoagulation therapy prior to starting a new medication or having a procedure   Follow-up Appointments:  PCP Hospital f/u appt confirmed? yes Patient saw 02/20/21 on 02/04/21.   Specialist Hospital f/u appt confirmed? Yes Patient saw Dr. 14/2/22 on 02/10/21.   If their condition worsens, is the pt aware to call PCP or go to the Emergency Dept.? yes  Final Patient Assessment: Confirmed with patient he had refills of Xarelto at home pharmacy.  We reviewed  Advair and rinsing mouth after each use.  We reviewed the use of albuterol as a rescue inhaler.

## 2021-02-17 ENCOUNTER — Ambulatory Visit: Payer: Federal, State, Local not specified - PPO | Admitting: Cardiology

## 2021-03-01 ENCOUNTER — Other Ambulatory Visit: Payer: Self-pay | Admitting: Registered Nurse

## 2021-03-01 MED ORDER — FUROSEMIDE 20 MG PO TABS: 20.0000 mg | ORAL_TABLET | Freq: Two times a day (BID) | ORAL | 0 refills | Status: DC

## 2021-03-01 NOTE — Telephone Encounter (Signed)
Pt called in asking for a refill on the Lasix, pt states he only has 2 more days. He uses [facility] [address]

## 2021-03-01 NOTE — Telephone Encounter (Signed)
Patient is requesting a refill of the following medications: Requested Prescriptions   Pending Prescriptions Disp Refills   furosemide (LASIX) 20 MG tablet 30 tablet 0    Sig: Take 1 tablet (20 mg total) by mouth 2 (two) times daily.    Date of patient request: 03/01/2021 Last office visit: 02/09/2021 Date of last refill: 07/07/2020 Last refill amount:  Follow up time period per chart: 05/05/2021

## 2021-03-11 ENCOUNTER — Ambulatory Visit: Payer: Medicare Other | Admitting: Cardiology

## 2021-03-11 ENCOUNTER — Encounter: Payer: Self-pay | Admitting: Cardiology

## 2021-03-11 ENCOUNTER — Other Ambulatory Visit: Payer: Self-pay

## 2021-03-11 VITALS — BP 130/79 | HR 79 | Temp 97.0°F | Resp 12 | Ht 75.0 in | Wt 343.4 lb

## 2021-03-11 DIAGNOSIS — I10 Essential (primary) hypertension: Secondary | ICD-10-CM

## 2021-03-11 DIAGNOSIS — Z6841 Body Mass Index (BMI) 40.0 and over, adult: Secondary | ICD-10-CM

## 2021-03-11 DIAGNOSIS — E66813 Obesity, class 3: Secondary | ICD-10-CM

## 2021-03-11 DIAGNOSIS — I2510 Atherosclerotic heart disease of native coronary artery without angina pectoris: Secondary | ICD-10-CM

## 2021-03-11 DIAGNOSIS — I4510 Unspecified right bundle-branch block: Secondary | ICD-10-CM

## 2021-03-11 DIAGNOSIS — I519 Heart disease, unspecified: Secondary | ICD-10-CM

## 2021-03-11 DIAGNOSIS — I2699 Other pulmonary embolism without acute cor pulmonale: Secondary | ICD-10-CM

## 2021-03-11 DIAGNOSIS — Z87891 Personal history of nicotine dependence: Secondary | ICD-10-CM

## 2021-03-11 NOTE — Progress Notes (Signed)
Date:  03/11/2021   ID:  Matthew Oconnell, DOB 16-Feb-1956, MRN [id number]  PCP:  Maximiano Coss, NP  Cardiologist:  Rex Kras, DO, [facility] (established care 03/11/2021)  REASON FOR CONSULT: Pulmonary embolism  REQUESTING PHYSICIAN:  Maximiano Coss, NP [address] [place],  [place] [id number]  Chief Complaint  Patient presents with   Establish Care    Recent hospitalization. Pulmonary embolism.    HPI  Matthew Oconnell is a 66 y.o. Caucasian male who presents to the office with a chief complaint of " recent pulmonary embolism establish care with cardiology." Patient's past medical history and cardiovascular risk factors include: Left upper lobe pulmonary embolism (01/2021), coronary artery calcification (non gated CT study), right bundle branch block, hypertension, obesity due to excess calories.  He is referred to the office at the request of Maximiano Coss, NP for evaluation of pulmonary embolism.  Patient was recently hospitalized from January 27, 2021 through January 31, 2021 due to shortness of breath.  He was noted to have pneumonia and left upper lobe pulmonary embolism.  He was started on oral anticoagulation during hospitalization.  He was also discharged on home O2 which is currently being managed by pulmonary medicine.  Echocardiogram during hospitalization noted moderately enlarged right ventricular size and reduced function.  He is now referred to cardiology given his recent pulmonary embolism.  It appears that the patient is also following up with pulmonary medicine and therefore will defer management of pulmonary embolism to Dr. Erin Fulling for now.  Agree with his recommendations on keeping oral anticoagulation for 6 months given the provoked pulmonary embolism index event in November 2022.  The CT PE protocol also noted coronary artery calcification.  This study is not gated and therefore the severity of coronary calcification is unknown.  He has not had any cardiovascular  work-up in the past including stress test or heart catheterization.  No recent lipid profile for review.  He does not have any active chest pain at rest or with effort related activities.  He is working on lifestyle changes and has lost approximately 37 pounds as of August 2022.  FUNCTIONAL STATUS: No structured exercise program or daily routine due to recent PE and needing oxygen.   ALLERGIES: Allergies  Allergen Reactions   Cefuroxime Rash   Sulfamethoxazole-Trimethoprim Rash    MEDICATION LIST PRIOR TO VISIT: Current Meds  Medication Sig   acetaminophen (TYLENOL) 500 MG tablet Take 1,000 mg by mouth every 6 (six) hours as needed for moderate pain, headache or fever.   azelastine (ASTELIN) 0.1 % nasal spray Place 1 spray into both nostrils 2 (two) times daily. Use in each nostril as directed   fexofenadine (ALLEGRA) 180 MG tablet Take 180 mg by mouth daily as needed for allergies or rhinitis.   fluticasone (FLONASE) 50 MCG/ACT nasal spray 2 sprays daily as needed for allergies.   fluticasone-salmeterol (ADVAIR) 250-50 MCG/ACT AEPB Inhale 1 puff into the lungs 2 (two) times daily.   furosemide (LASIX) 20 MG tablet Take 1 tablet (20 mg total) by mouth 2 (two) times daily.   ipratropium (ATROVENT) 0.03 % nasal spray Place 2 sprays into both nostrils daily as needed for rhinitis.   lisinopril (ZESTRIL) 10 MG tablet Take 10 mg by mouth daily.   montelukast (SINGULAIR) 10 MG tablet Take 1 tablet (10 mg total) by mouth daily.   rivaroxaban (XARELTO) 20 MG TABS tablet Take 1 tablet (20 mg total) by mouth daily with supper.   RIVAROXABAN (  XARELTO) VTE STARTER PACK (15 & 20 MG) Take one $Remove'15mg'gcrPneI$  tablet by mouth twice a day until 02/17/21. On 02/18/21, switch to one $Remo'20mg'WbhjH$  tablet once a day. Take with food.     PAST MEDICAL HISTORY: Past Medical History:  Diagnosis Date   Asthmatic bronchitis with acute exacerbation    Cellulitis    Coronary artery calcification    Ganglion cyst    Of Left Hand    Gout    Hypertension    Hyperuricemia    Leg swelling    Mild depression    Mild persistent asthma    Morbid obesity (HCC)    Osteoarthritis    Prediabetes    Pulmonary embolism (HCC)    Recurrent sinusitis    Redness    Bilateral lower extremities   Stasis dermatitis of both legs    Venous insufficiency     PAST SURGICAL HISTORY: Past Surgical History:  Procedure Laterality Date   COLONOSCOPY N/A 05/04/2008   ganglion cyst removed from L hand Left    Right hand repair for a compound break Right     FAMILY HISTORY: The patient family history includes Other in his father; Prostate cancer in his brother.  SOCIAL HISTORY:  The patient  reports that he quit smoking about 8 years ago. His smoking use included cigarettes. He has a 20.00 pack-year smoking history. He has never used smokeless tobacco. He reports current alcohol use. He reports that he does not currently use drugs.  REVIEW OF SYSTEMS: Review of Systems  Constitutional: Negative for chills and fever.  HENT:  Negative for hoarse voice and nosebleeds.        Nasal congestion  Eyes:  Negative for discharge, double vision and pain.  Cardiovascular:  Negative for chest pain, claudication, dyspnea on exertion, leg swelling, near-syncope, orthopnea, palpitations, paroxysmal nocturnal dyspnea and syncope.  Respiratory:  Positive for shortness of breath (chronic and stable). Negative for hemoptysis.   Musculoskeletal:  Positive for joint pain (knee bilateral). Negative for muscle cramps and myalgias.  Gastrointestinal:  Negative for abdominal pain, constipation, diarrhea, hematemesis, hematochezia, melena, nausea and vomiting.  Neurological:  Negative for dizziness and light-headedness.   PHYSICAL EXAM: Vitals with BMI 03/11/2021 02/10/2021 02/09/2021  Height $Remov'6\' 3"'DhMPPa$  $Remove'6\' 3"'nAObSBp$  $RemoveB'6\' 3"'YHMtBBik$   Weight 343 lbs 6 oz 340 lbs 338 lbs 14 oz  BMI 42.92 92.3 30.07  Systolic 622 633 354  Diastolic 79 72 78  Pulse 79 94 106    CONSTITUTIONAL:  Well-developed and well-nourished. No acute distress.  SKIN: Skin is warm and dry. No rash noted. No cyanosis. No pallor. No jaundice HEAD: Normocephalic and atraumatic.  EYES: No scleral icterus MOUTH/THROAT: Moist oral membranes.  NECK: No JVD present. No thyromegaly noted. No carotid bruits  LYMPHATIC: No visible cervical adenopathy.  CHEST Normal respiratory effort. No intercostal retractions  LUNGS: Clear to auscultation bilaterally.  No stridor. No wheezes. No rales.  CARDIOVASCULAR: Regular rate and rhythm, positive S1-S2, no murmurs rubs or gallops appreciated. ABDOMINAL: Obese, soft, nontender, nondistended, positive bowel sounds all 4 quadrants. No apparent ascites.  EXTREMITIES: No peripheral edema, warm to touch, 2+ bilateral  DP and PT pulses HEMATOLOGIC: No significant bruising NEUROLOGIC: Oriented to person, place, and time. Nonfocal. Normal muscle tone.  PSYCHIATRIC: Normal mood and affect. Normal behavior. Cooperative  RADIOLOGY: CT PE protocol: 01/27/2021 1. Left upper lobe lobar pulmonary embolus. There is dilatation of  the right ventricle which may be associated with underlying right heart strain. Evaluation of the distal  segmental and subsegmental arteries is limited due to suboptimal opacification, mixing artifact, and respiratory motion. 2. Patchy opacities in the lungs bilaterally, which may represent infectious or inflammatory pneumonitis. 3. Cardiomegaly with coronary artery calcifications. There is dilatation of the pulmonary trunk suggesting underlying pulmonary artery hypertension.  CARDIAC DATABASE: EKG: 03/11/2021: NSR, 97 bpm, right bundle branch block.  Echocardiogram: 01/28/2021:  1. Left ventricular ejection fraction, by estimation, is 60 to 65%. The left ventricle has normal function. The left ventricle has no regional wall motion abnormalities. Left ventricular diastolic parameters were normal.   2. Right ventricular systolic function is moderately  reduced. The right ventricular size is moderately enlarged.   3. Left atrial size was mildly dilated.   4. The mitral valve is normal in structure. No evidence of mitral valve regurgitation. No evidence of mitral stenosis.   5. The aortic valve is tricuspid. Aortic valve regurgitation is not visualized. No aortic stenosis is present.   6. Aortic dilatation noted. There is mild dilatation of the ascending aorta, measuring 38 mm.   7. The inferior vena cava is dilated in size with >50% respiratory variability, suggesting right atrial pressure of 8 mmHg.  Stress Testing: No results found for this or any previous visit from the past 1095 days.   Heart Catheterization: None  LABORATORY DATA: CBC Latest Ref Rng & Units 02/04/2021 01/31/2021 01/30/2021  WBC 4.0 - 10.5 K/uL 13.4(H) 19.1(H) 22.6(H)  Hemoglobin 13.0 - 17.0 g/dL 15.4 13.8 13.4  Hematocrit 39.0 - 52.0 % 45.9 42.0 39.0  Platelets 150.0 - 400.0 K/uL 405.0(H) 363 324    CMP Latest Ref Rng & Units 02/04/2021 01/31/2021 01/30/2021  Glucose 70 - 99 mg/dL 162(H) 221(H) 189(H)  BUN 6 - 23 mg/dL $Remove'15 17 18  'wspkOYf$ Creatinine 0.40 - 1.50 mg/dL 1.05 1.00 1.07  Sodium 135 - 145 mEq/L 133(L) 133(L) 133(L)  Potassium 3.5 - 5.1 mEq/L 4.3 4.3 4.3  Chloride 96 - 112 mEq/L 95(L) 95(L) 98  CO2 19 - 32 mEq/L $Remove'29 28 28  'rUXhUEm$ Calcium 8.4 - 10.5 mg/dL 9.6 8.8(L) 8.8(L)  Total Protein 6.0 - 8.3 g/dL 7.4 - -  Total Bilirubin 0.2 - 1.2 mg/dL 0.7 - -  Alkaline Phos 39 - 117 U/L 79 - -  AST 0 - 37 U/L 24 - -  ALT 0 - 53 U/L 40 - -    Lipid Panel     Component Value Date/Time   TRIG 87 01/27/2021 1901    No components found for: NTPROBNP No results for input(s): PROBNP in the last 8760 hours. Recent Labs    01/28/21 0648  TSH 0.542    BMP Recent Labs    01/29/21 0111 01/30/21 0100 01/31/21 0053 02/04/21 1032  NA 131* 133* 133* 133*  K 4.2 4.3 4.3 4.3  CL 98 98 95* 95*  CO2 $Re'25 28 28 29  'ypz$ GLUCOSE 304* 189* 221* 162*  BUN $Re'16 18 17 15  'wSG$ CREATININE  1.08 1.07 1.00 1.05  CALCIUM 8.9 8.8* 8.8* 9.6  GFRNONAA >60 >60 >60  --     HEMOGLOBIN A1C Lab Results  Component Value Date   HGBA1C 6.5 (H) 01/29/2021   MPG 140 01/29/2021    IMPRESSION:    ICD-10-CM   1. Coronary atherosclerosis due to calcified coronary lesion  I25.10 CMP14+EGFR   I25.84 Lipid Panel With LDL/HDL Ratio    LDL cholesterol, direct    PCV MYOCARDIAL PERFUSION WITH LEXISCAN    CT CARDIAC SCORING ([facility] LOCATIONS ONLY)  2. Right ventricular dysfunction  I51.9     3. Pulmonary embolism left upper lobe, 01/2021  I26.99 EKG 12-Lead    4. RBBB  I45.10     5. Benign hypertension  I10     6. Former smoker  Z87.891     21. Class 3 severe obesity due to excess calories with serious comorbidity and body mass index (BMI) of 40.0 to 44.9 in adult [facility])  E66.01    Z68.41        RECOMMENDATIONS: KENJI MAPEL is a 66 y.o. Caucasian male whose past medical history and cardiac risk factors include: Left upper lobe pulmonary embolism (01/2021), coronary artery calcification (non gated CT study), right bundle branch block, hypertension, former smoker, obesity due to excess calories.  Coronary atherosclerosis due to calcified coronary lesion Noted on non gated CT study in November 2022.  Will order CAC scoring to further evaluate the disease burden.  Start aspirin 81 mg p.o. daily. Check fasting lipid profile, CMP, direct LDL. Based on the results of the lipid profile will determine statin therapy. Echocardiogram results from November 2022 independently reviewed and noted above for further reference. With regards to ischemic work-up patient would benefit from stress test given his risk factors.  However, given his right bundle branch block, recent pulmonary embolism, and being discharged on home O2 would benefit from pharmacological stress as opposed to exercise stress test. Further recommendations to follow.  Right ventricular dysfunction Had an echocardiogram  during his hospitalization for acute pulmonary embolism in November 2022.  He was noted to have moderately reduced right ventricular systolic function and moderately enlarged right ventricular size. Discordant findings include RV S prime 17.1 cm/s and TAPSE 3.1 cm. His RV dysfunction may be multifactorial given his recent acute pulmonary embolism, hypoxia, undiagnosed sleep apnea, and recent pneumonia. It would be beneficial to reevaluate his right ventricular size and function after he completes treatment for pulmonary embolism, has been evaluated for sleep apnea (has a follow-up appointment in February 2023), and would recommend PFTs given his underlying asthma and questionable COPD given his smoking history. Further recommendations to follow.  Pulmonary embolism left upper lobe, 01/2021 Provoked pulmonary embolism Currently being followed by pulmonary medicine Agree with recommendations with regards to 6 months of anticoagulation and reevaluate.  RBBB Continue to monitor  Benign hypertension Office blood pressures are well controlled. Medications reconciled. Reemphasized the importance of low-salt diet.  Former smoker Educated on the importance of continued smoking cessation.   Class 3 severe obesity due to excess calories with serious comorbidity and body mass index (BMI) of 40.0 to 44.9 in adult [facility] Body mass index is 42.92 kg/m. I reviewed with the patient the importance of diet, regular physical activity/exercise, weight loss.   Patient is educated on increasing physical activity gradually as tolerated.  With the goal of moderate intensity exercise for 30 minutes a day 5 days a week.  FINAL MEDICATION LIST END OF ENCOUNTER: No orders of the defined types were placed in this encounter.   There are no discontinued medications.   Current Outpatient Medications:    acetaminophen (TYLENOL) 500 MG tablet, Take 1,000 mg by mouth every 6 (six) hours as needed for moderate pain,  headache or fever., Disp: , Rfl:    azelastine (ASTELIN) 0.1 % nasal spray, Place 1 spray into both nostrils 2 (two) times daily. Use in each nostril as directed, Disp: 30 mL, Rfl: 12   fexofenadine (ALLEGRA) 180 MG tablet, Take 180 mg by mouth daily  as needed for allergies or rhinitis., Disp: , Rfl:    fluticasone (FLONASE) 50 MCG/ACT nasal spray, 2 sprays daily as needed for allergies., Disp: , Rfl:    fluticasone-salmeterol (ADVAIR) 250-50 MCG/ACT AEPB, Inhale 1 puff into the lungs 2 (two) times daily., Disp: 60 each, Rfl: 11   furosemide (LASIX) 20 MG tablet, Take 1 tablet (20 mg total) by mouth 2 (two) times daily., Disp: 30 tablet, Rfl: 0   ipratropium (ATROVENT) 0.03 % nasal spray, Place 2 sprays into both nostrils daily as needed for rhinitis., Disp: 30 mL, Rfl: 11   lisinopril (ZESTRIL) 10 MG tablet, Take 10 mg by mouth daily., Disp: , Rfl:    montelukast (SINGULAIR) 10 MG tablet, Take 1 tablet (10 mg total) by mouth daily., Disp: 90 tablet, Rfl: 3   rivaroxaban (XARELTO) 20 MG TABS tablet, Take 1 tablet (20 mg total) by mouth daily with supper., Disp: 90 tablet, Rfl: 0   RIVAROXABAN (XARELTO) VTE STARTER PACK (15 & 20 MG), Take one $Remove'15mg'QQOwHlJ$  tablet by mouth twice a day until 02/17/21. On 02/18/21, switch to one $Remo'20mg'DCHHP$  tablet once a day. Take with food., Disp: 51 each, Rfl: 2  Orders Placed This Encounter  Procedures   CT CARDIAC SCORING ([facility] LOCATIONS ONLY)   CMP14+EGFR   Lipid Panel With LDL/HDL Ratio   LDL cholesterol, direct   PCV MYOCARDIAL PERFUSION WITH LEXISCAN   EKG 12-Lead    There are no Patient Instructions on file for this visit.   --Continue cardiac medications as reconciled in final medication list. --Return in about 6 weeks (around 04/22/2021) for Follow up, Coronary artery calcification, Review test results. Or sooner if needed. --Continue follow-up with your primary care physician regarding the management of your other chronic comorbid conditions.  Patient's questions  and concerns were addressed to his satisfaction. He voices understanding of the instructions provided during this encounter.   This note was created using a voice recognition software as a result there may be grammatical errors inadvertently enclosed that do not reflect the nature of this encounter. Every attempt is made to correct such errors.  Rex Kras, [place], [facility]  Pager: [phone] Office: [phone]

## 2021-03-15 ENCOUNTER — Ambulatory Visit: Payer: Federal, State, Local not specified - PPO | Admitting: Pulmonary Disease

## 2021-03-15 LAB — LIPID PANEL WITH LDL/HDL RATIO
Cholesterol, Total: 191 mg/dL (ref 100–199)
HDL: 49 mg/dL (ref >39)
LDL Chol Calc (NIH): 118 mg/dL — ABNORMAL HIGH (ref 0–99)
LDL/HDL Ratio: 2.4 ratio (ref 0.0–3.6)
Triglycerides: 136 mg/dL (ref 0–149)
VLDL Cholesterol Cal: 24 mg/dL (ref 5–40)

## 2021-03-15 LAB — CMP14+EGFR
ALT: 16 IU/L (ref 0–44)
AST: 15 IU/L (ref 0–40)
Albumin/Globulin Ratio: 1.3 (ref 1.2–2.2)
Albumin: 3.8 g/dL (ref 3.8–4.8)
Alkaline Phosphatase: 111 IU/L (ref 44–121)
BUN/Creatinine Ratio: 12 (ref 10–24)
BUN: 10 mg/dL (ref 8–27)
Bilirubin Total: 0.5 mg/dL (ref 0.0–1.2)
CO2: 24 mmol/L (ref 20–29)
Calcium: 9.5 mg/dL (ref 8.6–10.2)
Chloride: 103 mmol/L (ref 96–106)
Creatinine, Ser: 0.85 mg/dL (ref 0.76–1.27)
Globulin, Total: 2.9 g/dL (ref 1.5–4.5)
Glucose: 148 mg/dL — ABNORMAL HIGH (ref 70–99)
Potassium: 4.5 mmol/L (ref 3.5–5.2)
Sodium: 142 mmol/L (ref 134–144)
Total Protein: 6.7 g/dL (ref 6.0–8.5)
eGFR: 96 mL/min/{1.73_m2} (ref >59)

## 2021-03-15 LAB — LDL CHOLESTEROL, DIRECT: LDL Direct: 119 mg/dL — ABNORMAL HIGH (ref 0–99)

## 2021-03-15 NOTE — Progress Notes (Signed)
Called pt to inform him to make sure to finish all his diagnostic test before making another fu appt pt understood

## 2021-03-17 ENCOUNTER — Telehealth: Payer: Self-pay | Admitting: Registered Nurse

## 2021-03-17 ENCOUNTER — Other Ambulatory Visit: Payer: Self-pay

## 2021-03-17 MED ORDER — FUROSEMIDE 20 MG PO TABS: 20.0000 mg | ORAL_TABLET | Freq: Two times a day (BID) | ORAL | 0 refills | Status: DC

## 2021-03-17 MED ORDER — LISINOPRIL 10 MG PO TABS: 10.0000 mg | ORAL_TABLET | Freq: Every day | ORAL | 1 refills | Status: DC

## 2021-03-17 NOTE — Telephone Encounter (Signed)
Sent rx refills pt will need to keep follow up

## 2021-03-17 NOTE — Telephone Encounter (Signed)
Pt called in asking for refills on the Lisinopril and furosemide.  Pt uses [facility] on [address]

## 2021-03-31 ENCOUNTER — Other Ambulatory Visit: Payer: Self-pay

## 2021-03-31 ENCOUNTER — Ambulatory Visit (INDEPENDENT_AMBULATORY_CARE_PROVIDER_SITE_OTHER): Payer: Medicare Other | Admitting: Pulmonary Disease

## 2021-03-31 ENCOUNTER — Encounter: Payer: Self-pay | Admitting: Pulmonary Disease

## 2021-03-31 VITALS — BP 130/72 | HR 86 | Ht 75.0 in | Wt 316.2 lb

## 2021-03-31 DIAGNOSIS — J4540 Moderate persistent asthma, uncomplicated: Secondary | ICD-10-CM | POA: Diagnosis not present

## 2021-03-31 DIAGNOSIS — I2609 Other pulmonary embolism with acute cor pulmonale: Secondary | ICD-10-CM | POA: Diagnosis not present

## 2021-03-31 NOTE — Progress Notes (Signed)
Synopsis: Referred in December 2022 for ashtma and pulmonary emboli by Zannie Cove, MD  Subjective:   PATIENT ID: Matthew Oconnell GENDER: male DOB: 09/09/1955, MRN: [id number]  HPI  Chief Complaint  Patient presents with   Follow-up    4 wk f/u. States he has been doing well since last visit. Has not used oxygen at home in about 2-3 weeks.    Matthew Oconnell is a 66 year old male, former smoker with hypertension and asthma who returns to pulmonary clinic for hospital follow up of pneumonia, asthma exacerbation and pulmonary emboli.   Patient reports his breathing is much better since last visit. He continues to take xarelto for the pulmonary embolus. He was recently evaluated by Dr. Odis Hollingshead of Cardiology on 03/11/21, note reviewed. He has NM cardiac scan coming up on 1/31 and a sleep study scheduled in February 2023. He reports about a 20lbs weight gain over the holiday season. He lost his wife and father last year, so this was the first set of holidays without them. He continues on advair disjus 250-39mcg 1 puff twice daily for asthma.  OV 02/10/21 Patient was admitted 11/24-11/28 and was discharged on 2 L of supplemental oxygen.  He reports significant improvement since hospitalization with improvement in his shortness of breath.  He is currently taking Xarelto for the pulmonary embolus.  CTA chest 11/24 was notable for left upper lobe lobar filling defect concerning for pulmonary embolus.  There was also a left lower lobe opacity concerning for pneumonia.  The pulmonary artery trunk was enlarged concerning for pulmonary arterial hypertension.  Echo showed normal LVEF with moderately enlarged RV with moderate decrease in RV function.  Patient reports prolonged car travel from [place] a couple weeks to 1 month prior to his admission.  No prior history of blood clots.  No family history of blood clots.  He reports history of asthma in which she was diagnosed 3 to 4 years ago.  He was using  Advair discus inhaler once daily but has been using it twice daily since hospitalization.  He has as needed albuterol but rarely uses this.  He has lost 37 pounds since August as he has been working on weight loss with diet changes and portion control.  He does report significant seasonal allergies.  He does report history of bilateral lower extremity edema which is improved since hospitalization.  He is a former smoker and quit in 2015.  He retired this past September from the [employer].  He was previously in the [employer] for 17 years.  His wife recently passed away last month from sepsis.  Past Medical History:  Diagnosis Date   Asthmatic bronchitis with acute exacerbation    Cellulitis    Coronary artery calcification    Ganglion cyst    Of Left Hand   Gout    Hypertension    Hyperuricemia    Leg swelling    Mild depression    Mild persistent asthma    Morbid obesity (HCC)    Osteoarthritis    Prediabetes    Pulmonary embolism (HCC)    Recurrent sinusitis    Redness    Bilateral lower extremities   Stasis dermatitis of both legs    Venous insufficiency      Family History  Problem Relation Age of Onset   Other Father        sepsis   Prostate cancer Brother      Social History   Socioeconomic History  Marital status: Widowed    Spouse name: Not on file   Number of children: 4   Years of education: Not on file   Highest education level: Not on file  Occupational History   Not on file  Tobacco Use   Smoking status: Former    Packs/day: 1.00    Years: 20.00    Pack years: 20.00    Types: Cigarettes    Quit date: 2015    Years since quitting: 8.0   Smokeless tobacco: Never  Vaping Use   Vaping Use: Never used  Substance and Sexual Activity   Alcohol use: Yes    Comment: occasionally   Drug use: Not Currently   Sexual activity: Not on file  Other Topics Concern   Not on file  Social History Narrative   Not on file   Social Determinants of Health    Financial Resource Strain: Not on file  Food Insecurity: Not on file  Transportation Needs: Not on file  Physical Activity: Not on file  Stress: Not on file  Social Connections: Not on file  Intimate Partner Violence: Not on file     Allergies  Allergen Reactions   Cefuroxime Rash   Sulfamethoxazole-Trimethoprim Rash     Outpatient Medications Prior to Visit  Medication Sig Dispense Refill   acetaminophen (TYLENOL) 500 MG tablet Take 1,000 mg by mouth every 6 (six) hours as needed for moderate pain, headache or fever.     azelastine (ASTELIN) 0.1 % nasal spray Place 1 spray into both nostrils 2 (two) times daily. Use in each nostril as directed 30 mL 12   fexofenadine (ALLEGRA) 180 MG tablet Take 180 mg by mouth daily as needed for allergies or rhinitis.     fluticasone (FLONASE) 50 MCG/ACT nasal spray 2 sprays daily as needed for allergies.     fluticasone-salmeterol (ADVAIR) 250-50 MCG/ACT AEPB Inhale 1 puff into the lungs 2 (two) times daily. 60 each 11   furosemide (LASIX) 20 MG tablet Take 1 tablet (20 mg total) by mouth 2 (two) times daily. 30 tablet 0   ipratropium (ATROVENT) 0.03 % nasal spray Place 2 sprays into both nostrils daily as needed for rhinitis. 30 mL 11   lisinopril (ZESTRIL) 10 MG tablet Take 1 tablet (10 mg total) by mouth daily. 90 tablet 1   montelukast (SINGULAIR) 10 MG tablet Take 1 tablet (10 mg total) by mouth daily. 90 tablet 3   rivaroxaban (XARELTO) 20 MG TABS tablet Take 1 tablet (20 mg total) by mouth daily with supper. 90 tablet 0   RIVAROXABAN (XARELTO) VTE STARTER PACK (15 & 20 MG) Take one 15mg  tablet by mouth twice a day until 02/17/21. On 02/18/21, switch to one 20mg  tablet once a day. Take with food. 51 each 2   No facility-administered medications prior to visit.   Review of Systems  Constitutional:  Negative for chills, fever, malaise/fatigue and weight loss.  HENT:  Negative for congestion, sinus pain and sore throat.   Eyes: Negative.    Respiratory:  Negative for cough, hemoptysis, sputum production, shortness of breath and wheezing.   Cardiovascular:  Negative for chest pain, palpitations, orthopnea, claudication and leg swelling.  Gastrointestinal:  Negative for abdominal pain, heartburn, nausea and vomiting.  Genitourinary: Negative.   Musculoskeletal:  Negative for joint pain and myalgias.  Skin:  Negative for rash.  Neurological:  Negative for weakness.  Endo/Heme/Allergies: Negative.   Psychiatric/Behavioral: Negative.     Objective:   Vitals:   03/31/21  1156  BP: 130/72  Pulse: 86  SpO2: 99%  Weight: (!) 316 lb 3.2 oz (143.4 kg)  Height: 6\' 3"  (1.905 m)   Physical Exam Constitutional:      General: He is not in acute distress.    Appearance: He is obese.  HENT:     Head: Normocephalic and atraumatic.  Eyes:     Conjunctiva/sclera: Conjunctivae normal.  Cardiovascular:     Rate and Rhythm: Normal rate and regular rhythm.     Pulses: Normal pulses.     Heart sounds: Normal heart sounds. No murmur heard. Pulmonary:     Effort: Pulmonary effort is normal.     Breath sounds: Normal breath sounds.  Abdominal:     General: Bowel sounds are normal.     Palpations: Abdomen is soft.  Musculoskeletal:     Right lower leg: No edema.     Left lower leg: No edema.  Lymphadenopathy:     Cervical: No cervical adenopathy.  Skin:    General: Skin is warm and dry.  Neurological:     General: No focal deficit present.     Mental Status: He is alert.  Psychiatric:        Mood and Affect: Mood normal.        Behavior: Behavior normal.        Thought Content: Thought content normal.        Judgment: Judgment normal.   CBC    Component Value Date/Time   WBC 13.4 (H) 02/04/2021 1032   RBC 4.77 02/04/2021 1032   HGB 15.4 02/04/2021 1032   HCT 45.9 02/04/2021 1032   PLT 405.0 (H) 02/04/2021 1032   MCV 96.3 02/04/2021 1032   MCH 32.0 01/31/2021 0053   MCHC 33.5 02/04/2021 1032   RDW 13.5 02/04/2021 1032    LYMPHSABS 1.7 02/04/2021 1032   MONOABS 0.9 02/04/2021 1032   EOSABS 0.1 02/04/2021 1032   BASOSABS 0.1 02/04/2021 1032   BMP Latest Ref Rng & Units 03/14/2021 02/04/2021 01/31/2021  Glucose 70 - 99 mg/dL 161(W148(H) 960(A162(H) 540(J221(H)  BUN 8 - 27 mg/dL 10 15 17   Creatinine 0.76 - 1.27 mg/dL 8.110.85 9.141.05 7.821.00  BUN/Creat Ratio 10 - 24 12 - -  Sodium 134 - 144 mmol/L 142 133(L) 133(L)  Potassium 3.5 - 5.2 mmol/L 4.5 4.3 4.3  Chloride 96 - 106 mmol/L 103 95(L) 95(L)  CO2 20 - 29 mmol/L 24 29 28   Calcium 8.6 - 10.2 mg/dL 9.5 9.6 9.5(A8.8(L)   Chest imaging: CTA Chest 01/27/21 1. Left upper lobe lobar pulmonary embolus. There is dilatation of the right ventricle which may be associated with underlying right heart strain. Evaluation of the distal segmental and subsegmental arteries is limited due to suboptimal opacification, mixing artifact, and respiratory motion. 2. Patchy opacities in the lungs bilaterally, which may represent infectious or inflammatory pneumonitis. 3. Cardiomegaly with coronary artery calcifications. There is dilatation of the pulmonary trunk suggesting underlying pulmonary artery hypertension.  PFT: No flowsheet data found.  Labs:  Path:  Echo 01/28/21: LVEF 60 to 65%.  LV normal function.  LV diastolic parameters normal.  RV systolic function moderately reduced with moderate enlargement.  LA size is mildly dilated.  Heart Catheterization:  Assessment & Plan:   Acute pulmonary embolism with acute cor pulmonale, unspecified pulmonary embolism type (HCC)  Moderate persistent asthma without complication - Plan: Pulmonary Function Test, Ambulatory Referral for DME  Discussion: Matthew Oconnell is a 66 year old male, former smoker with hypertension  and asthma returns to pulmonary clinic for hospital follow up of pneumonia, asthma exacerbation and pulmonary emboli.   Patient has been doing well since last visit.  He is to continue Advair 250-50 MCG 1 puff twice daily for his  history of asthma.  He can continue albuterol as needed.  He is to continue Xarelto for the acute pulmonary embolism which was provoked by prolonged car travel and acute pneumonia.  We will plan to treat for 6 months.  He is now following with cardiology and has a stress test coming up.   He has a sleep study in February through [facility].  His home O2 orders can be cancelled as he does not desaturate on simple walk.  Follow-up in 3 months with PFTs  Melody Comas, MD  Pulmonary & Critical Care Office: [phone]    Current Outpatient Medications:    acetaminophen (TYLENOL) 500 MG tablet, Take 1,000 mg by mouth every 6 (six) hours as needed for moderate pain, headache or fever., Disp: , Rfl:    azelastine (ASTELIN) 0.1 % nasal spray, Place 1 spray into both nostrils 2 (two) times daily. Use in each nostril as directed, Disp: 30 mL, Rfl: 12   fexofenadine (ALLEGRA) 180 MG tablet, Take 180 mg by mouth daily as needed for allergies or rhinitis., Disp: , Rfl:    fluticasone (FLONASE) 50 MCG/ACT nasal spray, 2 sprays daily as needed for allergies., Disp: , Rfl:    fluticasone-salmeterol (ADVAIR) 250-50 MCG/ACT AEPB, Inhale 1 puff into the lungs 2 (two) times daily., Disp: 60 each, Rfl: 11   furosemide (LASIX) 20 MG tablet, Take 1 tablet (20 mg total) by mouth 2 (two) times daily., Disp: 30 tablet, Rfl: 0   ipratropium (ATROVENT) 0.03 % nasal spray, Place 2 sprays into both nostrils daily as needed for rhinitis., Disp: 30 mL, Rfl: 11   lisinopril (ZESTRIL) 10 MG tablet, Take 1 tablet (10 mg total) by mouth daily., Disp: 90 tablet, Rfl: 1   montelukast (SINGULAIR) 10 MG tablet, Take 1 tablet (10 mg total) by mouth daily., Disp: 90 tablet, Rfl: 3   rivaroxaban (XARELTO) 20 MG TABS tablet, Take 1 tablet (20 mg total) by mouth daily with supper., Disp: 90 tablet, Rfl: 0   RIVAROXABAN (XARELTO) VTE STARTER PACK (15 & 20 MG), Take one 15mg  tablet by mouth twice a day  until 02/17/21. On 02/18/21, switch to one 20mg  tablet once a day. Take with food., Disp: 51 each, Rfl: 2

## 2021-03-31 NOTE — Patient Instructions (Signed)
We will send an order to your oxygen company ([employer]) to discontinue the supplies/equipment  Continue advair diskus 1 puff twice daily  Use albuterol inhaler as needed  Continue Xarelto for pulmonary embolism  Follow up in 3 months for pulmonary function tests

## 2021-04-01 ENCOUNTER — Telehealth: Payer: Self-pay

## 2021-04-01 MED ORDER — FUROSEMIDE 20 MG PO TABS: 20.0000 mg | ORAL_TABLET | Freq: Two times a day (BID) | ORAL | 1 refills | Status: DC

## 2021-04-01 NOTE — Telephone Encounter (Signed)
Caller name:Hamed Broaden   On DPR? :Yes  Call back number:[phone]  Provider they see: Richard  Reason for call:furosemide (LASIX) 20 MG tablet ,  [facility] [address], [place] - [address]  [address] [place] [address]

## 2021-04-01 NOTE — Telephone Encounter (Signed)
Refilled for 90 days, called pt and LM to let him know this has been sent

## 2021-04-01 NOTE — Telephone Encounter (Signed)
Pt is aware.  

## 2021-04-05 ENCOUNTER — Other Ambulatory Visit: Payer: Medicare Other

## 2021-04-25 ENCOUNTER — Inpatient Hospital Stay: Admission: RE | Admit: 2021-04-25 | Payer: Medicare Other | Source: Ambulatory Visit

## 2021-04-26 ENCOUNTER — Institutional Professional Consult (permissible substitution): Payer: Medicare Other | Admitting: Neurology

## 2021-04-26 ENCOUNTER — Other Ambulatory Visit: Payer: Self-pay | Admitting: Registered Nurse

## 2021-04-26 DIAGNOSIS — R0681 Apnea, not elsewhere classified: Secondary | ICD-10-CM

## 2021-04-30 IMAGING — CT CT MAXILLOFACIAL W/O CM
1 series · 15 of 30 positions shown, 19 images · non-contrast
Comparison: 07/06/2006

CLINICAL DATA: Chronic sinusitis with congestion drainage

EXAM:
CT MAXILLOFACIAL WITHOUT CONTRAST
TECHNIQUE: Multidetector CT images of the paranasal sinuses were obtained using
the standard protocol without intravenous contrast.

[Series 4: soft tissue · axial · 0.42mm/px · z∈[-171,-40]mm · 15 of 141 slices shown, 19 images]
[im 5/141  brain]
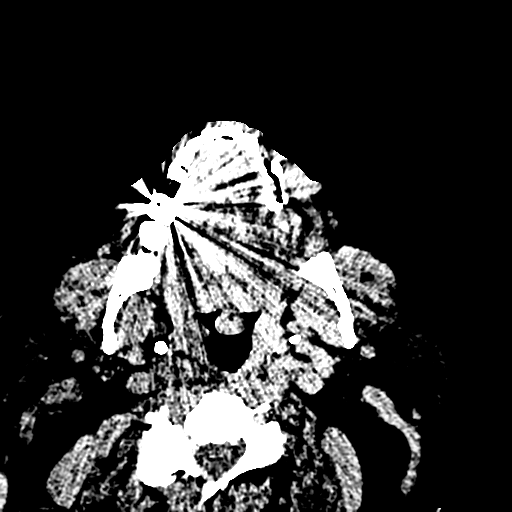
[im 5/141  bone]
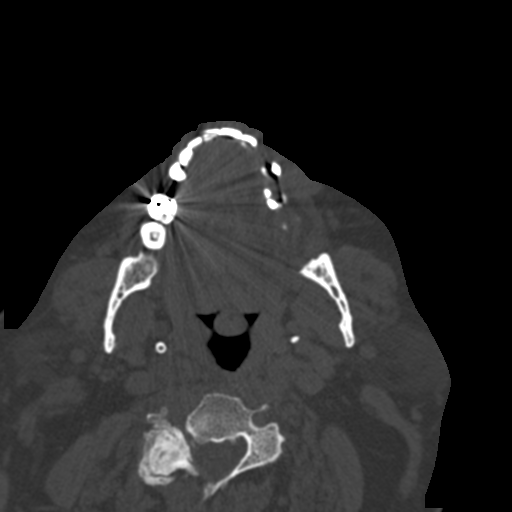
[im 15/141  bone]
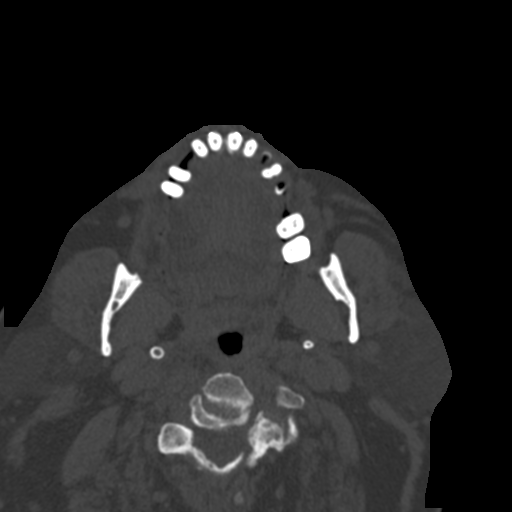
[im 25/141  bone]
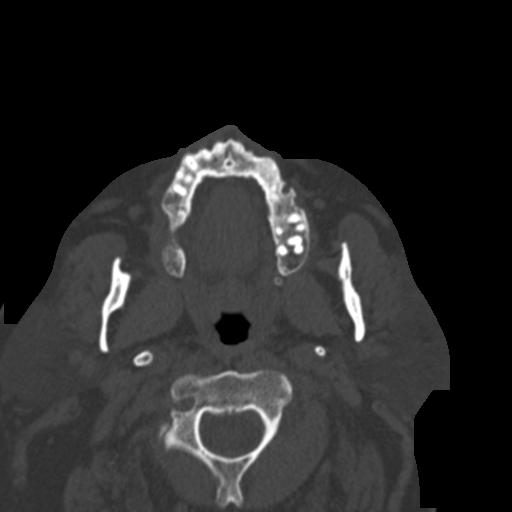
[im 34/141  bone]
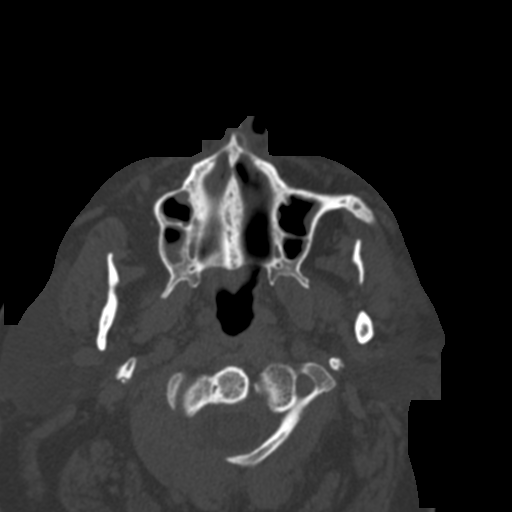
[im 44/141  brain]
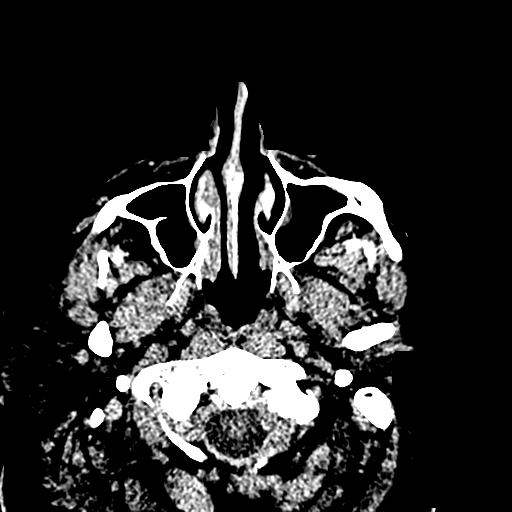
[im 44/141  bone]
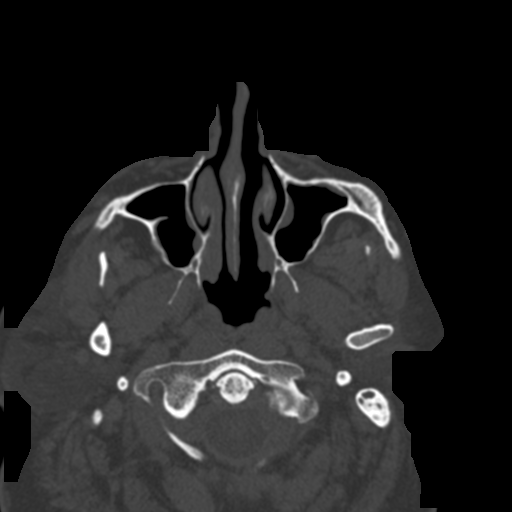
[im 54/141  bone]
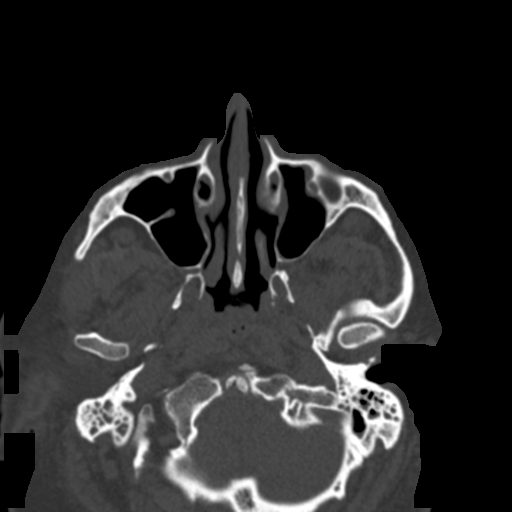
[im 63/141  bone]
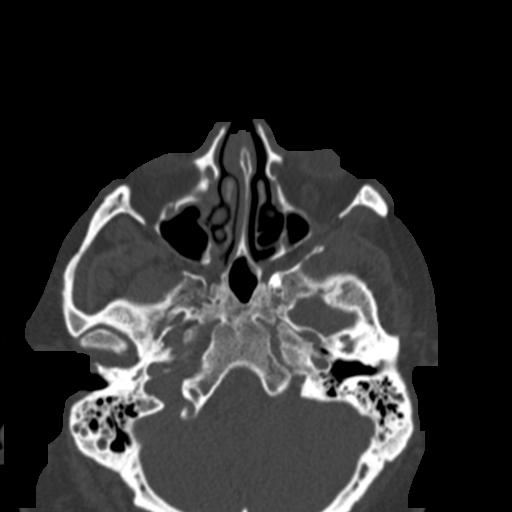
[im 73/141  bone]
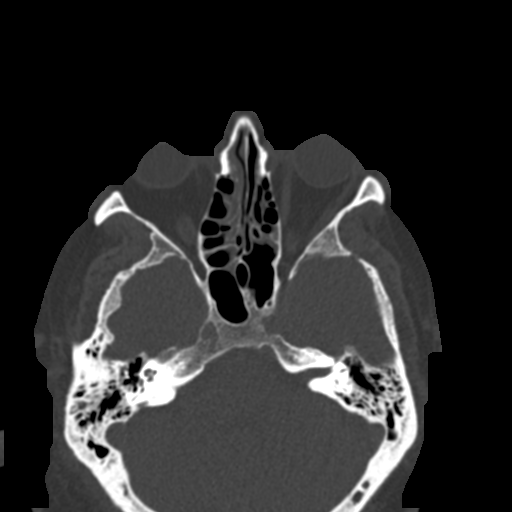
[im 78/141  brain]
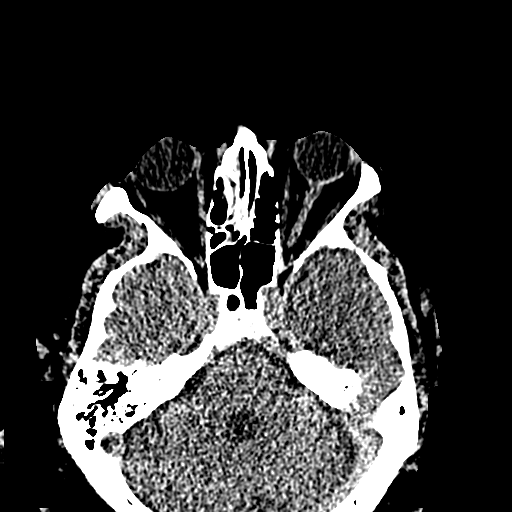
[im 78/141  bone]
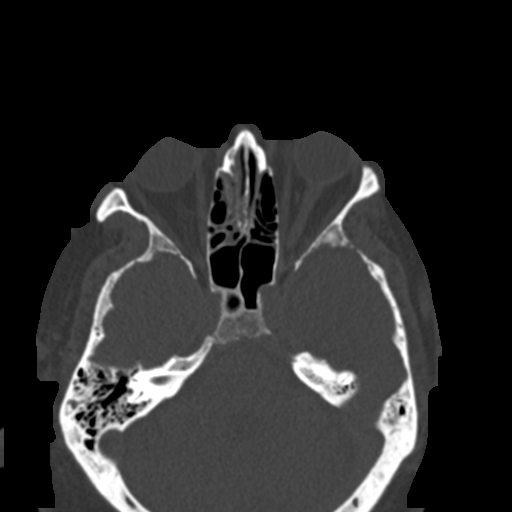
[im 87/141  bone]
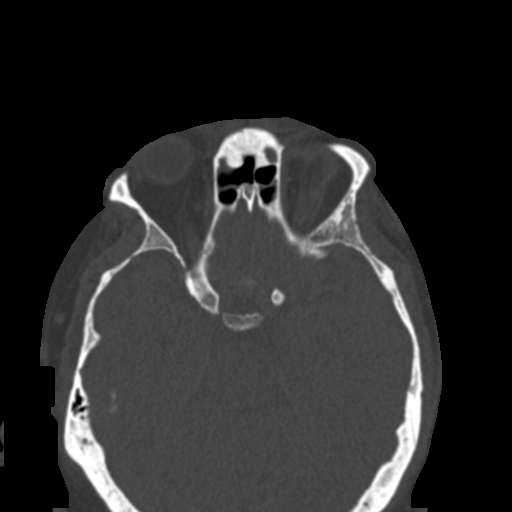
[im 97/141  bone]
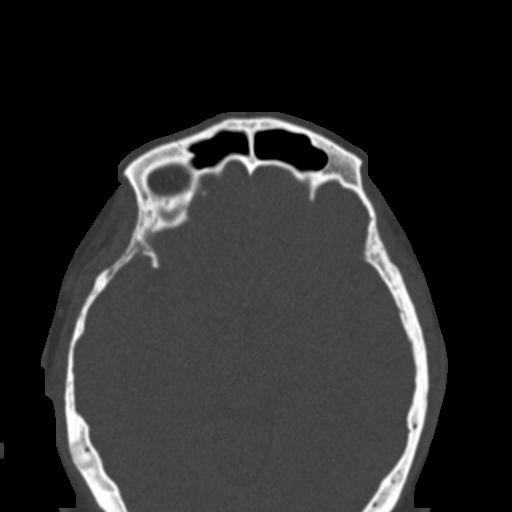
[im 107/141  bone]
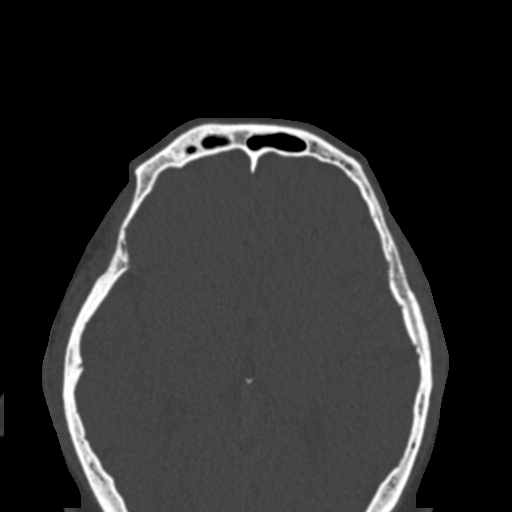
[im 116/141  brain]
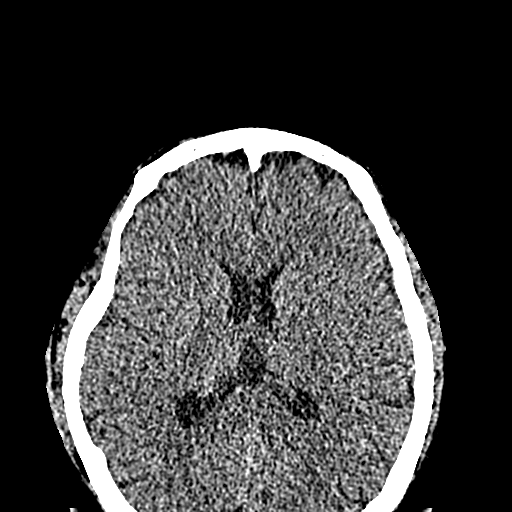
[im 116/141  bone]
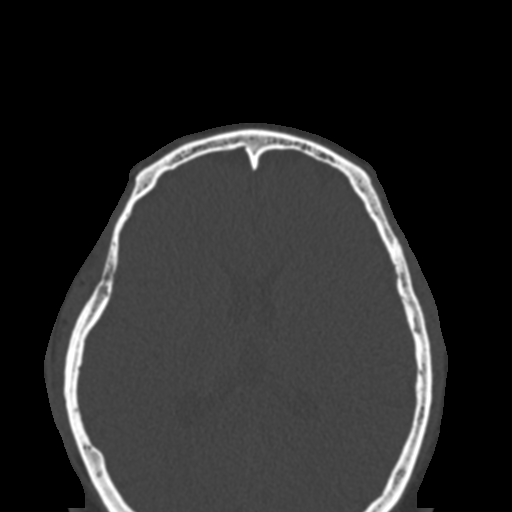
[im 126/141  bone]
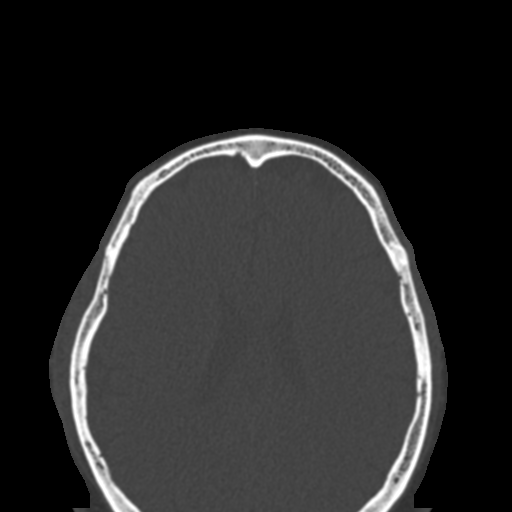
[im 136/141  bone]
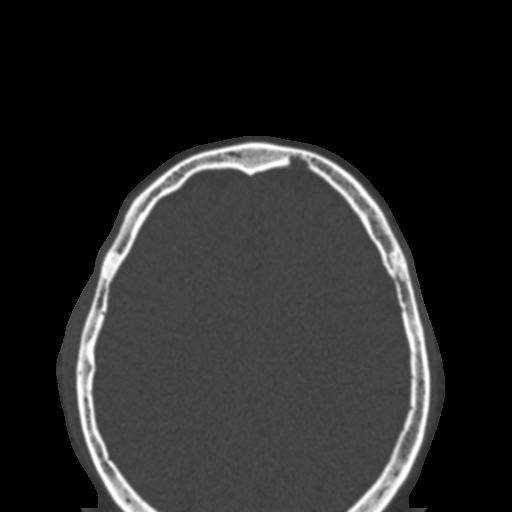

[15 of 30 positions shown; findings below may reference images not displayed]

FINDINGS: Paranasal sinuses:

Frontal: Both frontal ethmoidal recesses are opacified by mucosal
thickening.

Ethmoid: Mild patchy mucosal thickening bilaterally.

Maxillary: Mild mucosal thickening symmetrically.

Sphenoid: Mucosal thickening over rides both sphenoid sinus ostia.
The left sphenoid sinus is essentially in the midline and inferior.

Right ostiomeatal unit: Mucosal thickening effaces the infundibulum.
Patent middle meatus

Left ostiomeatal unit: Mucosal thickening effaces the infundibulum.
Patent middle meatus.

Nasal passages: Patent.  Intact nasal septum is nearly midline.

Anatomy: No pneumatization superior to anterior ethmoid notches.
Sellar sphenoid pneumatization pattern. No dehiscence of carotid or
optic canals. Bilateral onodi cell.

Other: Exuberant stylomastoid ligament ossification, incidental
based on the history. Partial bilateral mastoid opacification.
IMPRESSION: 1. Generalized mild to moderate mucosal thickening with sinus
outflow obstruction. The degree of mucosal disease is similar to a
0662 comparison.
2. Bilateral Onodi cell.
3. Partial bilateral mastoid opacification.

## 2021-05-05 ENCOUNTER — Ambulatory Visit: Payer: Medicare Other | Admitting: Registered Nurse

## 2021-05-17 ENCOUNTER — Encounter: Payer: Self-pay | Admitting: Registered Nurse

## 2021-05-17 ENCOUNTER — Ambulatory Visit: Payer: Federal, State, Local not specified - PPO

## 2021-05-17 ENCOUNTER — Ambulatory Visit (INDEPENDENT_AMBULATORY_CARE_PROVIDER_SITE_OTHER): Payer: Medicare Other | Admitting: Registered Nurse

## 2021-05-17 VITALS — BP 130/70 | HR 86 | Temp 97.8°F | Resp 17 | Wt 365.4 lb

## 2021-05-17 DIAGNOSIS — F339 Major depressive disorder, recurrent, unspecified: Secondary | ICD-10-CM | POA: Diagnosis not present

## 2021-05-17 DIAGNOSIS — Z8639 Personal history of other endocrine, nutritional and metabolic disease: Secondary | ICD-10-CM

## 2021-05-17 DIAGNOSIS — I10 Essential (primary) hypertension: Secondary | ICD-10-CM

## 2021-05-17 LAB — CBC WITH DIFFERENTIAL/PLATELET
Basophils Absolute: 0 10*3/uL (ref 0.0–0.1)
Basophils Relative: 0.4 % (ref 0.0–3.0)
Eosinophils Absolute: 0.2 10*3/uL (ref 0.0–0.7)
Eosinophils Relative: 2.1 % (ref 0.0–5.0)
HCT: 47 % (ref 39.0–52.0)
Hemoglobin: 16.3 g/dL (ref 13.0–17.0)
Lymphocytes Relative: 21.5 % (ref 12.0–46.0)
Lymphs Abs: 1.7 10*3/uL (ref 0.7–4.0)
MCHC: 34.7 g/dL (ref 30.0–36.0)
MCV: 94.8 fl (ref 78.0–100.0)
Monocytes Absolute: 0.7 10*3/uL (ref 0.1–1.0)
Monocytes Relative: 8.7 % (ref 3.0–12.0)
Neutro Abs: 5.4 10*3/uL (ref 1.4–7.7)
Neutrophils Relative %: 67.3 % (ref 43.0–77.0)
Platelets: 266 10*3/uL (ref 150.0–400.0)
RBC: 4.95 Mil/uL (ref 4.22–5.81)
RDW: 13.2 % (ref 11.5–15.5)
WBC: 8 10*3/uL (ref 4.0–10.5)

## 2021-05-17 LAB — COMPREHENSIVE METABOLIC PANEL
ALT: 15 U/L (ref 0–53)
AST: 11 U/L (ref 0–37)
Albumin: 4 g/dL (ref 3.5–5.2)
Alkaline Phosphatase: 89 U/L (ref 39–117)
BUN: 10 mg/dL (ref 6–23)
CO2: 25 mEq/L (ref 19–32)
Calcium: 9.4 mg/dL (ref 8.4–10.5)
Chloride: 100 mEq/L (ref 96–112)
Creatinine, Ser: 0.92 mg/dL (ref 0.40–1.50)
GFR: 87.18 mL/min (ref >60.00)
Glucose, Bld: 146 mg/dL — ABNORMAL HIGH (ref 70–99)
Potassium: 4.2 mEq/L (ref 3.5–5.1)
Sodium: 136 mEq/L (ref 135–145)
Total Bilirubin: 0.6 mg/dL (ref 0.2–1.2)
Total Protein: 6.6 g/dL (ref 6.0–8.3)

## 2021-05-17 LAB — LIPID PANEL
Cholesterol: 179 mg/dL (ref 0–200)
HDL: 50 mg/dL (ref >39.00)
LDL Cholesterol: 103 mg/dL — ABNORMAL HIGH (ref 0–99)
NonHDL: 128.6
Total CHOL/HDL Ratio: 4
Triglycerides: 127 mg/dL (ref 0.0–149.0)
VLDL: 25.4 mg/dL (ref 0.0–40.0)

## 2021-05-17 LAB — HEMOGLOBIN A1C: Hgb A1c MFr Bld: 6.2 % (ref 4.6–6.5)

## 2021-05-17 LAB — TSH: TSH: 1.69 u[IU]/mL (ref 0.35–5.50)

## 2021-05-17 NOTE — Patient Instructions (Signed)
Mr. Perkins -  ? ?Great to see you ? ?Glad you're pulling out of the slump. Let me know if I can help in any way ? ?Labs today will be back this afternoon. I'll call with any acute concerns. ? ?See you in 6 mo at the latest, sooner if you need anything or if labs indicate ? ?Thanks, ? ?Rich  ?

## 2021-05-17 NOTE — Progress Notes (Signed)
? ?Established Patient Office Visit ? ?Subjective:  ?Patient ID: Matthew Oconnell, male    DOB: [birth year]  Age: 66 y.o. MRN: [id number] ? ?CC:  ?Chief Complaint  ?Patient presents with  ? pulmonary embolism  ?  3 month follow up and labs. No concerns for today per patient  ? ? ?HPI ?Matthew Oconnell presents for 3 mo follow up ? ?No acute concerns.  ? ?Since last visit, has followed with pulmonary. He will have appt with them to discuss likely apnea on 05/23/21. ?No ongoing symptoms of PE. Feeling resolved.  ? ?Last labs show elevated glucose to 148, mildly elevated Lipids.  ?LFTs wnl. Renal function wnl.  ? ?Notes he had been feeling down / depressed through winter. Improving since.  ?No interest in medication or therapy at this time. Denies hi/si ?Past Medical History:  ?Diagnosis Date  ? Asthmatic bronchitis with acute exacerbation   ? Cellulitis   ? Coronary artery calcification   ? Ganglion cyst   ? Of Left Hand  ? Gout   ? Hypertension   ? Hyperuricemia   ? Leg swelling   ? Mild depression   ? Mild persistent asthma   ? Morbid obesity ([place])   ? Osteoarthritis   ? Prediabetes   ? Pulmonary embolism ([place])   ? Recurrent sinusitis   ? Redness   ? Bilateral lower extremities  ? Stasis dermatitis of both legs   ? Venous insufficiency   ? ? ?Past Surgical History:  ?Procedure Laterality Date  ? COLONOSCOPY N/A 05/04/2008  ? ganglion cyst removed from L hand Left   ? Right hand repair for a compound break Right   ? ? ?Family History  ?Problem Relation Age of Onset  ? Other Father   ?     sepsis  ? Prostate cancer Brother   ? ? ?Social History  ? ?Socioeconomic History  ? Marital status: Widowed  ?  Spouse name: Not on file  ? Number of children: 4  ? Years of education: Not on file  ? Highest education level: Not on file  ?Occupational History  ? Not on file  ?Tobacco Use  ? Smoking status: Former  ?  Packs/day: 1.00  ?  Years: 20.00  ?  Pack years: 20.00  ?  Types: Cigarettes  ?  Quit date: 2015  ?  Years since  quitting: 8.2  ? Smokeless tobacco: Never  ?Vaping Use  ? Vaping Use: Never used  ?Substance and Sexual Activity  ? Alcohol use: Yes  ?  Comment: occasionally  ? Drug use: Not Currently  ? Sexual activity: Not on file  ?Other Topics Concern  ? Not on file  ?Social History Narrative  ? Not on file  ? ?Social Determinants of Health  ? ?Financial Resource Strain: Not on file  ?Food Insecurity: Not on file  ?Transportation Needs: Not on file  ?Physical Activity: Not on file  ?Stress: Not on file  ?Social Connections: Not on file  ?Intimate Partner Violence: Not on file  ? ? ?Outpatient Medications Prior to Visit  ?Medication Sig Dispense Refill  ? acetaminophen (TYLENOL) 500 MG tablet Take 1,000 mg by mouth every 6 (six) hours as needed for moderate pain, headache or fever.    ? azelastine (ASTELIN) 0.1 % nasal spray Place 1 spray into both nostrils 2 (two) times daily. Use in each nostril as directed 30 mL 12  ? fexofenadine (ALLEGRA) 180 MG tablet Take 180 mg by mouth daily  as needed for allergies or rhinitis.    ? fluticasone (FLONASE) 50 MCG/ACT nasal spray 2 sprays daily as needed for allergies.    ? fluticasone-salmeterol (ADVAIR) 250-50 MCG/ACT AEPB Inhale 1 puff into the lungs 2 (two) times daily. 60 each 11  ? furosemide (LASIX) 20 MG tablet Take 1 tablet (20 mg total) by mouth 2 (two) times daily. 180 tablet 1  ? ipratropium (ATROVENT) 0.03 % nasal spray Place 2 sprays into both nostrils daily as needed for rhinitis. 30 mL 11  ? lisinopril (ZESTRIL) 10 MG tablet Take 1 tablet (10 mg total) by mouth daily. 90 tablet 1  ? montelukast (SINGULAIR) 10 MG tablet Take 1 tablet (10 mg total) by mouth daily. 90 tablet 3  ? rivaroxaban (XARELTO) 20 MG TABS tablet Take 1 tablet (20 mg total) by mouth daily with supper. 90 tablet 0  ? RIVAROXABAN (XARELTO) VTE STARTER PACK (15 & 20 MG) Take one 69m tablet by mouth twice a day until 02/17/21. On 02/18/21, switch to one 233mtablet once a day. Take with food. 51 each 2   ? ?No facility-administered medications prior to visit.  ? ? ?Allergies  ?Allergen Reactions  ? Cefuroxime Rash  ? Sulfamethoxazole-Trimethoprim Rash  ? ? ?ROS ?Review of Systems  ?Constitutional: Negative.   ?HENT: Negative.    ?Eyes: Negative.   ?Respiratory: Negative.    ?Cardiovascular: Negative.   ?Gastrointestinal: Negative.   ?Genitourinary: Negative.   ?Musculoskeletal: Negative.   ?Skin: Negative.   ?Neurological: Negative.   ?Psychiatric/Behavioral: Negative.    ?All other systems reviewed and are negative. ? ?  ?Objective:  ?  ?Physical Exam ?Constitutional:   ?   General: He is not in acute distress. ?   Appearance: Normal appearance. He is obese. He is not ill-appearing, toxic-appearing or diaphoretic.  ?Cardiovascular:  ?   Rate and Rhythm: Normal rate and regular rhythm.  ?   Heart sounds: Normal heart sounds. No murmur heard. ?  No friction rub. No gallop.  ?Pulmonary:  ?   Effort: Pulmonary effort is normal. No respiratory distress.  ?   Breath sounds: Normal breath sounds. No stridor. No wheezing, rhonchi or rales.  ?Chest:  ?   Chest wall: No tenderness.  ?Neurological:  ?   General: No focal deficit present.  ?   Mental Status: He is alert and oriented to person, place, and time. Mental status is at baseline.  ?Psychiatric:     ?   Mood and Affect: Mood normal.     ?   Behavior: Behavior normal.     ?   Thought Content: Thought content normal.     ?   Judgment: Judgment normal.  ? ? ?BP 130/70   Pulse 86   Temp 97.8 ?F (36.6 ?C)   Resp 17   Wt (!) 365 lb 6.4 oz (165.7 kg)   SpO2 96%   BMI 45.67 kg/m?  ?Wt Readings from Last 3 Encounters:  ?05/17/21 (!) 365 lb 6.4 oz (165.7 kg)  ?03/31/21 (!) 316 lb 3.2 oz (143.4 kg)  ?03/11/21 (!) 343 lb 6.4 oz (155.8 kg)  ? ? ? ?Health Maintenance Due  ?Topic Date Due  ? Hepatitis C Screening  Never done  ? COLONOSCOPY (Pts [id number] coverage will need to be confirmed)  Never done  ? ? ?There are no preventive care reminders to display for this  patient. ? ?Lab Results  ?Component Value Date  ? TSH 0.542 01/28/2021  ? ?Lab Results  ?  Component Value Date  ? WBC 13.4 (H) 02/04/2021  ? HGB 15.4 02/04/2021  ? HCT 45.9 02/04/2021  ? MCV 96.3 02/04/2021  ? PLT 405.0 (H) 02/04/2021  ? ?Lab Results  ?Component Value Date  ? NA 142 03/14/2021  ? K 4.5 03/14/2021  ? CO2 24 03/14/2021  ? GLUCOSE 148 (H) 03/14/2021  ? BUN 10 03/14/2021  ? CREATININE 0.85 03/14/2021  ? BILITOT 0.5 03/14/2021  ? ALKPHOS 111 03/14/2021  ? AST 15 03/14/2021  ? ALT 16 03/14/2021  ? PROT 6.7 03/14/2021  ? ALBUMIN 3.8 03/14/2021  ? CALCIUM 9.5 03/14/2021  ? ANIONGAP 10 01/31/2021  ? EGFR 96 03/14/2021  ? GFR 74.54 02/04/2021  ? ?Lab Results  ?Component Value Date  ? CHOL 191 03/14/2021  ? ?Lab Results  ?Component Value Date  ? HDL 49 03/14/2021  ? ?Lab Results  ?Component Value Date  ? LDLCALC 118 (H) 03/14/2021  ? ?Lab Results  ?Component Value Date  ? TRIG 136 03/14/2021  ? ?No results found for: CHOLHDL ?Lab Results  ?Component Value Date  ? HGBA1C 6.5 (H) 01/29/2021  ? ? ?  ?Assessment & Plan:  ? ?Problem List Items Addressed This Visit   ? ?  ? Cardiovascular and Mediastinum  ? Hypertension - Primary  ? Relevant Orders  ? CBC with Differential/Platelet  ? Comprehensive metabolic panel  ? Hemoglobin A1c  ? Lipid panel  ? TSH  ?  ? Other  ? History of elevated glucose  ? Relevant Orders  ? CBC with Differential/Platelet  ? Comprehensive metabolic panel  ? Hemoglobin A1c  ? Lipid panel  ? TSH  ? Depression, recurrent ([place])  ? ? ?No orders of the defined types were placed in this encounter. ? ? ?Follow-up: No follow-ups on file.  ? ?PLAN ?Discussed healthy lifestyle, weight management ?Labs collected. Will follow up with the patient as warranted. ?Follow with pulmonary as scheduled ?Return in 6 mo for recheck ?Patient encouraged to call clinic with any questions, comments, or concerns. ? ?Maximiano Coss, NP ?

## 2021-05-23 ENCOUNTER — Other Ambulatory Visit: Payer: Self-pay | Admitting: Registered Nurse

## 2021-05-23 ENCOUNTER — Institutional Professional Consult (permissible substitution): Payer: Federal, State, Local not specified - PPO | Admitting: Primary Care

## 2021-05-23 DIAGNOSIS — I2699 Other pulmonary embolism without acute cor pulmonale: Secondary | ICD-10-CM

## 2021-05-23 NOTE — Telephone Encounter (Signed)
Pt called in asking if he needs to continue the Xarelto (the blood thinner) and if so he needs refills to be sent into the [facility] on [address].  ? ?Please advise  ?

## 2021-05-24 MED ORDER — RIVAROXABAN 20 MG PO TABS: 20.0000 mg | ORAL_TABLET | Freq: Every day | ORAL | 0 refills | Status: DC

## 2021-05-24 NOTE — Telephone Encounter (Signed)
Pt notes if he is to continue Xarelto he will need a refill, pended to requested pharmacy below  ?

## 2021-06-09 ENCOUNTER — Other Ambulatory Visit: Payer: Medicare Other

## 2021-06-09 ENCOUNTER — Institutional Professional Consult (permissible substitution): Payer: Medicare Other | Admitting: Primary Care

## 2021-06-09 NOTE — Progress Notes (Deleted)
? ?@Patient  ID: , male    DOB: 03/18/1955, 66 y.o.   MRN: [id number] ? ?No chief complaint on file. ? ? ?Referring provider: ?[id number], NP ? ?HPI: ?66 year old male, former smoker quit in 2015 (20-pack-year history).  Past medical history significant for mild persistent asthma, community-acquired pneumonia, allergic rhinitis, hypertension, acute pulmonary embolism, COVID-19 virus, prediabetes, gout, depression, obesity.  Patient of Dr. 2016 last seen in office on 03/31/2021.  Maintained on Advair 250-50 mcg 1 puff twice daily, ARN albuterol and Xarelto.  He was ordered for PFTs and advised to follow-up in 3 months.  He will need 6 months of anticoagulation. ? ?06/09/2021 ?Patient presents today for sleep consult.  He was referred by his primary care doctor due to witnessed apnea.  Patient had a sleep study in February 2022 through [facility]. ? ? ?? Prior sleep study or just referred  ? ? ?Sleep questionnaire ?Symptoms-    ?Prior sleep study-  ?Bedtime-  ?Time to fall asleep-  ?Nocturnal awakenings-  ?Out of bed/start of day-  ?Weight changes-  ?Do you operate heavy machinery-  ?Do you currently wear CPAP- ?Do you current wear oxygen-  ?Epworth-  ? ?Allergies  ?Allergen Reactions  ? Cefuroxime Rash  ? Sulfamethoxazole-Trimethoprim Rash  ? ? ?Immunization History  ?Administered Date(s) Administered  ? Influenza Split 11/28/2015  ? PFIZER(Purple Top)SARS-COV-2 Vaccination 06/17/2019  ? Pneumococcal Polysaccharide-23 01/31/2021  ? Tdap 07/20/2005, 07/16/2015  ? ? ?Past Medical History:  ?Diagnosis Date  ? Asthmatic bronchitis with acute exacerbation   ? Cellulitis   ? Coronary artery calcification   ? Ganglion cyst   ? Of Left Hand  ? Gout   ? Hypertension   ? Hyperuricemia   ? Leg swelling   ? Mild depression   ? Mild persistent asthma   ? Morbid obesity (HCC)   ? Osteoarthritis   ? Prediabetes   ? Pulmonary embolism (HCC)   ? Recurrent sinusitis   ? Redness   ? Bilateral lower  extremities  ? Stasis dermatitis of both legs   ? Venous insufficiency   ? ? ?Tobacco History: ?Social History  ? ?Tobacco Use  ?Smoking Status Former  ? Packs/day: 1.00  ? Years: 20.00  ? Pack years: 20.00  ? Types: Cigarettes  ? Quit date: 2015  ? Years since quitting: 8.2  ?Smokeless Tobacco Never  ? ?Counseling given: Not Answered ? ? ?Outpatient Medications Prior to Visit  ?Medication Sig Dispense Refill  ? acetaminophen (TYLENOL) 500 MG tablet Take 1,000 mg by mouth every 6 (six) hours as needed for moderate pain, headache or fever.    ? azelastine (ASTELIN) 0.1 % nasal spray Place 1 spray into both nostrils 2 (two) times daily. Use in each nostril as directed 30 mL 12  ? fexofenadine (ALLEGRA) 180 MG tablet Take 180 mg by mouth daily as needed for allergies or rhinitis.    ? fluticasone (FLONASE) 50 MCG/ACT nasal spray 2 sprays daily as needed for allergies.    ? fluticasone-salmeterol (ADVAIR) 250-50 MCG/ACT AEPB Inhale 1 puff into the lungs 2 (two) times daily. 60 each 11  ? furosemide (LASIX) 20 MG tablet Take 1 tablet (20 mg total) by mouth 2 (two) times daily. 180 tablet 1  ? ipratropium (ATROVENT) 0.03 % nasal spray Place 2 sprays into both nostrils daily as needed for rhinitis. 30 mL 11  ? lisinopril (ZESTRIL) 10 MG tablet Take 1 tablet (10 mg total) by mouth daily. 90 tablet 1  ?  montelukast (SINGULAIR) 10 MG tablet Take 1 tablet (10 mg total) by mouth daily. 90 tablet 3  ? rivaroxaban (XARELTO) 20 MG TABS tablet Take 1 tablet (20 mg total) by mouth daily with supper. 90 tablet 0  ? RIVAROXABAN (XARELTO) VTE STARTER PACK (15 & 20 MG) Take one 15mg  tablet by mouth twice a day until 02/17/21. On 02/18/21, switch to one 20mg  tablet once a day. Take with food. 51 each 2  ? ?No facility-administered medications prior to visit.  ? ? ? ? ?Review of Systems ? ?Review of Systems ? ? ?Physical Exam ? ?There were no vitals taken for this visit. ?Physical Exam  ? ?Lab Results: ? ?CBC ?   ?Component Value  Date/Time  ? WBC 8.0 05/17/2021 0821  ? RBC 4.95 05/17/2021 0821  ? HGB 16.3 05/17/2021 0821  ? HCT 47.0 05/17/2021 0821  ? PLT 266.0 05/17/2021 0821  ? MCV 94.8 05/17/2021 0821  ? MCH 32.0 01/31/2021 0053  ? MCHC 34.7 05/17/2021 0821  ? RDW 13.2 05/17/2021 0821  ? LYMPHSABS 1.7 05/17/2021 0821  ? MONOABS 0.7 05/17/2021 0821  ? EOSABS 0.2 05/17/2021 0821  ? BASOSABS 0.0 05/17/2021 0821  ? ? ?BMET ?   ?Component Value Date/Time  ? NA 136 05/17/2021 0821  ? NA 142 03/14/2021 0811  ? K 4.2 05/17/2021 0821  ? CL 100 05/17/2021 0821  ? CO2 25 05/17/2021 0821  ? GLUCOSE 146 (H) 05/17/2021 05/19/2021  ? BUN 10 05/17/2021 0821  ? BUN 10 03/14/2021 0811  ? CREATININE 0.92 05/17/2021 0821  ? CALCIUM 9.4 05/17/2021 0821  ? GFRNONAA >60 01/31/2021 0053  ? ? ?BNP ?   ?Component Value Date/Time  ? BNP 58.0 01/28/2021 0648  ? ? ?ProBNP ?No results found for: PROBNP ? ?Imaging: ?No results found. ? ? ?Assessment & Plan:  ? ?No problem-specific Assessment & Plan notes found for this encounter. ? ? ? ? ?02/02/2021, NP ?06/09/2021 ? ?

## 2021-06-21 ENCOUNTER — Encounter: Payer: Self-pay | Admitting: Primary Care

## 2021-06-21 ENCOUNTER — Ambulatory Visit (INDEPENDENT_AMBULATORY_CARE_PROVIDER_SITE_OTHER): Payer: Medicare Other | Admitting: Primary Care

## 2021-06-21 DIAGNOSIS — I2699 Other pulmonary embolism without acute cor pulmonale: Secondary | ICD-10-CM

## 2021-06-21 DIAGNOSIS — R0683 Snoring: Secondary | ICD-10-CM

## 2021-06-21 NOTE — Patient Instructions (Addendum)
Sleep apnea is defined as period of 10 seconds or longer when you stop breathing at night. This can happen multiple times a night. Dx sleep apnea is when this occurs more than 5 times an hour.  ?  ?Mild OSA 5-15 apneic events an hour ?Moderate OSA 15-30 apneic events an hour ?Severe OSA > 30 apneic events an hour ?  ?Untreated sleep apnea puts you at higher risk for cardiac arrhythmias, pulmonary HTN, stroke and diabetes ?  ?Treatment options include weight loss, side sleeping position, oral appliance, CPAP therapy or referral to ENT for possible surgical options  ?  ?Recommendations: ?Focus on side sleeping position ?Work on weight loss efforts  ?Do not drive if experiencing excessive daytime sleepiness of fatigue  ?  ?Orders: ?Home sleep study re: snoring  ?  ?Follow-up: ?6-8 week visit to review sleep study/ or call once you have had sleep study to scheduled follow-up visit to review results  ? ?Sleep Apnea ?Sleep apnea is a condition in which breathing pauses or becomes shallow during sleep. People with sleep apnea usually snore loudly. They may have times when they gasp and stop breathing for 10 seconds or more during sleep. This may happen many times during the night. ?Sleep apnea disrupts your sleep and keeps your body from getting the rest that it needs. This condition can increase your risk of certain health problems, including: ?Heart attack. ?Stroke. ?Obesity. ?Type 2 diabetes. ?Heart failure. ?Irregular heartbeat. ?High blood pressure. ?The goal of treatment is to help you breathe normally again. ?What are the causes? ? ?The most common cause of sleep apnea is a collapsed or blocked airway. ?There are three kinds of sleep apnea: ?Obstructive sleep apnea. This kind is caused by a blocked or collapsed airway. ?Central sleep apnea. This kind happens when the part of the brain that controls breathing does not send the correct signals to the muscles that control breathing. ?Mixed sleep apnea. This is a  combination of obstructive and central sleep apnea. ?What increases the risk? ?You are more likely to develop this condition if you: ?Are overweight. ?Smoke. ?Have a smaller than normal airway. ?Are older. ?Are male. ?Drink alcohol. ?Take sedatives or tranquilizers. ?Have a family history of sleep apnea. ?Have a tongue or tonsils that are larger than normal. ?What are the signs or symptoms? ?Symptoms of this condition include: ?Trouble staying asleep. ?Loud snoring. ?Morning headaches. ?Waking up gasping. ?Dry mouth or sore throat in the morning. ?Daytime sleepiness and tiredness. ?If you have daytime fatigue because of sleep apnea, you may be more likely to have: ?Trouble concentrating. ?Forgetfulness. ?Irritability or mood swings. ?Personality changes. ?Feelings of depression. ?Sexual dysfunction. This may include loss of interest if you are male, or erectile dysfunction if you are male. ?How is this diagnosed? ?This condition may be diagnosed with: ?A medical history. ?A physical exam. ?A series of tests that are done while you are sleeping (sleep study). These tests are usually done in a sleep lab, but they may also be done at home. ?How is this treated? ?Treatment for this condition aims to restore normal breathing and to ease symptoms during sleep. It may involve managing health issues that can affect breathing, such as high blood pressure or obesity. Treatment may include: ?Sleeping on your side. ?Using a decongestant if you have nasal congestion. ?Avoiding the use of depressants, including alcohol, sedatives, and narcotics. ?Losing weight if you are overweight. ?Making changes to your diet. ?Quitting smoking. ?Using a device to open your airway  while you sleep, such as: ?An oral appliance. This is a custom-made mouthpiece that shifts your lower jaw forward. ?A continuous positive airway pressure (CPAP) device. This device blows air through a mask when you breathe out (exhale). ?A nasal expiratory positive  airway pressure (EPAP) device. This device has valves that you put into each nostril. ?A bi-level positive airway pressure (BIPAP) device. This device blows air through a mask when you breathe in (inhale) and breathe out (exhale). ?Having surgery if other treatments do not work. During surgery, excess tissue is removed to create a wider airway. ?Follow these instructions at home: ?Lifestyle ?Make any lifestyle changes that your health care provider recommends. ?Eat a healthy, well-balanced diet. ?Take steps to lose weight if you are overweight. ?Avoid using depressants, including alcohol, sedatives, and narcotics. ?Do not use any products that contain nicotine or tobacco. These products include cigarettes, chewing tobacco, and vaping devices, such as e-cigarettes. If you need help quitting, ask your health care provider. ?General instructions ?Take over-the-counter and prescription medicines only as told by your health care provider. ?If you were given a device to open your airway while you sleep, use it only as told by your health care provider. ?If you are having surgery, make sure to tell your health care provider you have sleep apnea. You may need to bring your device with you. ?Keep all follow-up visits. This is important. ?Contact a health care provider if: ?The device that you received to open your airway during sleep is uncomfortable or does not seem to be working. ?Your symptoms do not improve. ?Your symptoms get worse. ?Get help right away if: ?You develop: ?Chest pain. ?Shortness of breath. ?Discomfort in your back, arms, or stomach. ?You have: ?Trouble speaking. ?Weakness on one side of your body. ?Drooping in your face. ?These symptoms may represent a serious problem that is an emergency. Do not wait to see if the symptoms will go away. Get medical help right away. Call your local emergency services (911 in the [place] Do not drive yourself to the hospital. ?Summary ?Sleep apnea is a condition in which  breathing pauses or becomes shallow during sleep. ?The most common cause is a collapsed or blocked airway. ?The goal of treatment is to restore normal breathing and to ease symptoms during sleep. ?This information is not intended to replace advice given to you by your health care provider. Make sure you discuss any questions you have with your health care provider. ?Document Revised: 09/29/2020 Document Reviewed: 01/30/2020 ?Elsevier Patient Education ? 2023 Elsevier Inc[employer] ? ? ?

## 2021-06-21 NOTE — Progress Notes (Signed)
Reviewed and agree with assessment/plan. ? ? ?Rober Skeels, MD ?[facility] ?06/21/2021, 1:09 PM ?Pager:  [phone] ? ?

## 2021-06-21 NOTE — Assessment & Plan Note (Addendum)
Patient has symptoms of loud snoring. Epworth 5. BMI 46. Concern patient could have obstructive sleep apnea, needs home sleep study to evaluate. Discussed risk of untreated sleep apnea including cardiac arrhythmias, pulm HTN, stroke, DM. We briefly reviewed treatment options. Encouraged patient to work on weight loss efforts and focus on side sleeping position/elevate head of bed. Advised against driving if experiencing excessive daytime sleepiness. Follow-up in 6 weeks to review sleep study results and discuss treatment options further. ?

## 2021-06-21 NOTE — Assessment & Plan Note (Addendum)
-   Dx with provoked PE in November 2023, on anticoagulation with Xarelto x 6 months  ?

## 2021-06-21 NOTE — Progress Notes (Signed)
? ?@Patient  ID: , male    DOB: [birth year], 66 y.o.   MRN: [id number] ? ?Chief Complaint  ?Patient presents with  ? sleep consult  ?  Pt is being referred by PCP due to witnessed apnea event. Pt said that he does snore occasionally. States when he wakes up in the mornings, he does feel rested.  ? ? ?Referring provider: ?[id number], NP ? ?HPI: ?66 year old male, former smoker quit in 07-12-13 (20-pack-year history).  Past medical history significant for hypertension, acute pulmonary embolism, mild persistent asthma, allergic rhinitis, COVID-19 infection, gout, prediabetes, depression, impaired fasting glucose, obesity.  Patient of Dr. 2016. On Xarelto x 6 months and Advair 250-73mcg 1 puff twice daily.  ? ?06/21/2021 ?Patient presents today for sleep consult. He has been told that he snores. He reports sleeping well at night. He has severe sinus issues. After he gets off blood thinner he will be having sinus surgery. He was dx with PE in late November 2022. PE was felt to be provoked d.t prolonged car travel and acute pneumonia. He is currently on Xarelto, seeds 6 month anticoagulation treatment.  ? ?Both his father and wife passed away in the fall of Jul 12, 2020 and he gained 20-30 lbs over the holiday season. He is now trying to work on weight loss, his goal is to lose 150 lbs. He is going to the gym/silver sneakers. He reports feeling the best he has in a couple of years. His typical bedtime is between 10-11pm. It does not take him long to fall asleep. He starts his day a 5:30am. He has never had a sleep study. Denies narcolepsy, cataplexy or sleep walking.  ? ?Sleep questionnaire ?Symptoms-  Snoring  ?Prior sleep study- None  ?Bedtime- 10-11pm  ?Time to fall asleep- not long  ?Nocturnal awakenings- once  ?Out of bed/start of day- 5:30am  ?Weight changes- lost 30 lbs  ?Do you operate heavy machinery- No ?Do you currently wear CPAP- No ?Do you current wear oxygen- No ?Epworth- 5 ? ?Allergies  ?Allergen  Reactions  ? Cefuroxime Rash  ? Sulfamethoxazole-Trimethoprim Rash  ? ? ?Immunization History  ?Administered Date(s) Administered  ? Influenza Split 11/28/2015  ? PFIZER(Purple Top)SARS-COV-2 Vaccination 06/17/2019  ? Pneumococcal Polysaccharide-23 01/31/2021  ? Tdap 07/20/2005, 07/16/2015  ? ? ?Past Medical History:  ?Diagnosis Date  ? Asthmatic bronchitis with acute exacerbation   ? Cellulitis   ? Coronary artery calcification   ? Ganglion cyst   ? Of Left Hand  ? Gout   ? Hypertension   ? Hyperuricemia   ? Leg swelling   ? Mild depression   ? Mild persistent asthma   ? Morbid obesity (HCC)   ? Osteoarthritis   ? Prediabetes   ? Pulmonary embolism (HCC)   ? Recurrent sinusitis   ? Redness   ? Bilateral lower extremities  ? Stasis dermatitis of both legs   ? Venous insufficiency   ? ? ?Tobacco History: ?Social History  ? ?Tobacco Use  ?Smoking Status Former  ? Packs/day: 1.00  ? Years: 20.00  ? Pack years: 20.00  ? Types: Cigarettes  ? Quit date: 07/12/2013  ? Years since quitting: 8.2  ?Smokeless Tobacco Never  ? ?Counseling given: Not Answered ? ? ?Outpatient Medications Prior to Visit  ?Medication Sig Dispense Refill  ? acetaminophen (TYLENOL) 500 MG tablet Take 1,000 mg by mouth every 6 (six) hours as needed for moderate pain, headache or fever.    ? azelastine (ASTELIN) 0.1 % nasal spray  Place 1 spray into both nostrils 2 (two) times daily. Use in each nostril as directed 30 mL 12  ? fexofenadine (ALLEGRA) 180 MG tablet Take 180 mg by mouth daily as needed for allergies or rhinitis.    ? fluticasone (FLONASE) 50 MCG/ACT nasal spray 2 sprays daily as needed for allergies.    ? fluticasone-salmeterol (ADVAIR) 250-50 MCG/ACT AEPB Inhale 1 puff into the lungs 2 (two) times daily. 60 each 11  ? furosemide (LASIX) 20 MG tablet Take 1 tablet (20 mg total) by mouth 2 (two) times daily. 180 tablet 1  ? ipratropium (ATROVENT) 0.03 % nasal spray Place 2 sprays into both nostrils daily as needed for rhinitis. 30 mL 11  ?  lisinopril (ZESTRIL) 10 MG tablet Take 1 tablet (10 mg total) by mouth daily. 90 tablet 1  ? montelukast (SINGULAIR) 10 MG tablet Take 1 tablet (10 mg total) by mouth daily. 90 tablet 3  ? rivaroxaban (XARELTO) 20 MG TABS tablet Take 1 tablet (20 mg total) by mouth daily with supper. 90 tablet 0  ? RIVAROXABAN (XARELTO) VTE STARTER PACK (15 & 20 MG) Take one 15mg  tablet by mouth twice a day until 02/17/21. On 02/18/21, switch to one 20mg  tablet once a day. Take with food. 51 each 2  ? ?No facility-administered medications prior to visit.  ? ?Review of Systems ? ?Review of Systems  ?Constitutional:  Negative for fatigue.  ?HENT:  Positive for congestion, postnasal drip and rhinorrhea.   ?Respiratory: Negative.    ?Cardiovascular: Negative.   ? ? ?Physical Exam ? ?BP 126/64 (BP Location: Left Arm, Patient Position: Sitting, Cuff Size: Large)   Pulse 98   Temp 98.2 ?F (36.8 ?C) (Oral)   Ht 6\' 3"  (1.905 m)   Wt (!) 368 lb 3.2 oz (167 kg)   SpO2 94% Comment: RA  BMI 46.02 kg/m?  ?Physical Exam ?Constitutional:   ?   Appearance: Normal appearance. He is not ill-appearing.  ?HENT:  ?   Head: Normocephalic and atraumatic.  ?   Mouth/Throat:  ?   Mouth: Mucous membranes are moist.  ?   Pharynx: Oropharynx is clear.  ?Cardiovascular:  ?   Rate and Rhythm: Normal rate and regular rhythm.  ?Pulmonary:  ?   Effort: Pulmonary effort is normal.  ?   Breath sounds: No wheezing, rhonchi or rales.  ?Musculoskeletal:     ?   General: Normal range of motion.  ?Skin: ?   General: Skin is warm and dry.  ?Neurological:  ?   General: No focal deficit present.  ?   Mental Status: He is alert and oriented to person, place, and time. Mental status is at baseline.  ?Psychiatric:     ?   Mood and Affect: Mood normal.     ?   Behavior: Behavior normal.     ?   Thought Content: Thought content normal.     ?   Judgment: Judgment normal.  ?  ? ?Lab Results: ? ?CBC ?   ?Component Value Date/Time  ? WBC 8.0 05/17/2021 0821  ? RBC 4.95  05/17/2021 0821  ? HGB 16.3 05/17/2021 0821  ? HCT 47.0 05/17/2021 0821  ? PLT 266.0 05/17/2021 0821  ? MCV 94.8 05/17/2021 0821  ? MCH 32.0 01/31/2021 0053  ? MCHC 34.7 05/17/2021 0821  ? RDW 13.2 05/17/2021 0821  ? LYMPHSABS 1.7 05/17/2021 0821  ? MONOABS 0.7 05/17/2021 0821  ? EOSABS 0.2 05/17/2021 0821  ? BASOSABS 0.0 05/17/2021 0821  ? ? ?  BMET ?   ?Component Value Date/Time  ? NA 136 05/17/2021 0821  ? NA 142 03/14/2021 0811  ? K 4.2 05/17/2021 0821  ? CL 100 05/17/2021 0821  ? CO2 25 05/17/2021 0821  ? GLUCOSE 146 (H) 05/17/2021 0388  ? BUN 10 05/17/2021 0821  ? BUN 10 03/14/2021 0811  ? CREATININE 0.92 05/17/2021 0821  ? CALCIUM 9.4 05/17/2021 0821  ? GFRNONAA >60 01/31/2021 0053  ? ? ?BNP ?   ?Component Value Date/Time  ? BNP 58.0 01/28/2021 0648  ? ? ?ProBNP ?No results found for: PROBNP ? ?Imaging: ?No results found. ? ? ?Assessment & Plan:  ? ?Loud snoring ?Patient has symptoms of loud snoring. Epworth 5. BMI 46. Concern patient could have obstructive sleep apnea, needs home sleep study to evaluate. Discussed risk of untreated sleep apnea including cardiac arrhythmias, pulm HTN, stroke, DM. We briefly reviewed treatment options. Encouraged patient to work on weight loss efforts and focus on side sleeping position/elevate head of bed. Advised against driving if experiencing excessive daytime sleepiness. Follow-up in 6 weeks to review sleep study results and discuss treatment options further. ? ?Acute pulmonary embolism (HCC) ?- Dx with provoked PE in November 2023, on anticoagulation with Xarelto x 6 months  ? ? ?Glenford Bayley, NP ?06/21/2021 ? ?

## 2021-07-07 ENCOUNTER — Ambulatory Visit
Admission: RE | Admit: 2021-07-07 | Discharge: 2021-07-07 | Disposition: A | Discharge status: Home / Self Care | Payer: Federal, State, Local not specified - PPO | Source: Ambulatory Visit | Attending: Cardiology | Admitting: Cardiology

## 2021-07-07 DIAGNOSIS — I2510 Atherosclerotic heart disease of native coronary artery without angina pectoris: Secondary | ICD-10-CM

## 2021-07-20 ENCOUNTER — Ambulatory Visit (INDEPENDENT_AMBULATORY_CARE_PROVIDER_SITE_OTHER): Payer: Medicare Other | Admitting: Family Medicine

## 2021-07-20 ENCOUNTER — Encounter: Payer: Self-pay | Admitting: Family Medicine

## 2021-07-20 VITALS — BP 130/70 | HR 97 | Temp 97.6°F | Resp 17 | Ht 76.0 in | Wt 362.6 lb

## 2021-07-20 DIAGNOSIS — I2584 Coronary atherosclerosis due to calcified coronary lesion: Secondary | ICD-10-CM

## 2021-07-20 DIAGNOSIS — M109 Gout, unspecified: Secondary | ICD-10-CM | POA: Diagnosis not present

## 2021-07-20 DIAGNOSIS — I2510 Atherosclerotic heart disease of native coronary artery without angina pectoris: Secondary | ICD-10-CM | POA: Diagnosis not present

## 2021-07-20 MED ORDER — PREDNISONE 10 MG PO TABS: ORAL_TABLET | ORAL | 0 refills | Status: DC

## 2021-07-20 NOTE — Progress Notes (Signed)
? ?  Subjective:  ? ? Patient ID: Matthew Oconnell, male    DOB: 09/14/1955, 67 y.o.   MRN: [id number] ? ?HPI ?Gout- pt has hx of this.  Current flare is in L great toe.  Sxs started 3-4 days ago.  'i have a high uric acid level'.  Pt reports he has been 'drinking a lot of beer lately' after losing his wife in November.  This is 3rd episode in 10 yrs.  Toe was very red and swollen.  Warm to the touch.  Pt increased his water intake, increased his fruit intake.  Currently on Xarelto.  Pt has never been on Allopurinol. Feels that he doesn't have enough episodes to warrant a daily pill and 'i know what I did'.  ? ?Review of Systems ?For ROS see HPI  ?   ?Objective:  ? Physical Exam ?Vitals reviewed.  ?Constitutional:   ?   Appearance: Normal appearance. He is obese. He is not ill-appearing.  ?Musculoskeletal:     ?   General: Swelling (swelling and redness of L great toe) present.  ?Skin: ?   General: Skin is warm and dry.  ?   Findings: Erythema (of L great toe) present.  ?Neurological:  ?   General: No focal deficit present.  ?   Mental Status: He is alert and oriented to person, place, and time.  ?Psychiatric:     ?   Mood and Affect: Mood normal.     ?   Behavior: Behavior normal.     ?   Thought Content: Thought content normal.  ? ? ? ? ? ?   ?Assessment & Plan:  ?Gout- new to provider, recurrent for pt.  Since his is on Xarelto, not able to use NSAIDs.  Will treat w/ Prednisone.  Pt is not interested in a daily pill since his flares are infrequent.  Reviewed lifestyle modifications to reduce risk of recurrence.  Pt expressed understanding and is in agreement w/ plan.  ? ?

## 2021-07-20 NOTE — Patient Instructions (Signed)
Follow up as needed or as scheduled ?START the Prednisone as directed- 3 pills at the same time x3 days, then 2 pills x3 days, then 1 pill daily.  Take w/ food ?Continue to drink LOTS of water ?ICE as needed ?Call with any questions or concerns ?Hang in there!!! ? ?

## 2021-07-21 NOTE — Progress Notes (Signed)
Called and spoke to patient he was transferred to front desk to get stress test scheduled and f/u

## 2021-08-08 ENCOUNTER — Other Ambulatory Visit: Payer: Federal, State, Local not specified - PPO

## 2021-08-12 ENCOUNTER — Other Ambulatory Visit: Payer: Self-pay | Admitting: Registered Nurse

## 2021-08-12 DIAGNOSIS — I2699 Other pulmonary embolism without acute cor pulmonale: Secondary | ICD-10-CM

## 2021-08-15 ENCOUNTER — Ambulatory Visit: Payer: Federal, State, Local not specified - PPO | Admitting: Cardiology

## 2021-08-15 ENCOUNTER — Telehealth: Payer: Self-pay | Admitting: Registered Nurse

## 2021-08-15 NOTE — Telephone Encounter (Signed)
error 

## 2021-09-08 ENCOUNTER — Other Ambulatory Visit: Payer: Self-pay | Admitting: Registered Nurse

## 2021-09-12 ENCOUNTER — Other Ambulatory Visit: Payer: Federal, State, Local not specified - PPO

## 2021-09-16 ENCOUNTER — Ambulatory Visit: Payer: Federal, State, Local not specified - PPO | Admitting: Cardiology

## 2021-09-19 ENCOUNTER — Other Ambulatory Visit: Payer: Self-pay

## 2021-09-19 ENCOUNTER — Other Ambulatory Visit: Payer: Self-pay | Admitting: Registered Nurse

## 2021-09-19 DIAGNOSIS — I10 Essential (primary) hypertension: Secondary | ICD-10-CM

## 2021-09-19 MED ORDER — FUROSEMIDE 20 MG PO TABS: 20.0000 mg | ORAL_TABLET | Freq: Two times a day (BID) | ORAL | 1 refills | Status: DC

## 2021-09-20 ENCOUNTER — Other Ambulatory Visit: Payer: Self-pay

## 2021-09-20 ENCOUNTER — Telehealth: Payer: Self-pay | Admitting: Registered Nurse

## 2021-09-20 DIAGNOSIS — I10 Essential (primary) hypertension: Secondary | ICD-10-CM

## 2021-09-20 MED ORDER — FUROSEMIDE 20 MG PO TABS: 20.0000 mg | ORAL_TABLET | Freq: Two times a day (BID) | ORAL | 1 refills | Status: DC

## 2021-09-20 NOTE — Telephone Encounter (Signed)
Pt last appt was 09/16/21. Pt called stating he needs a refill on his medication Furosemide 20 mg. Pt wants refill to be sent [facility] on [address].

## 2021-10-10 ENCOUNTER — Other Ambulatory Visit: Payer: Federal, State, Local not specified - PPO

## 2021-10-17 ENCOUNTER — Ambulatory Visit: Payer: Federal, State, Local not specified - PPO | Admitting: Cardiology

## 2021-11-01 NOTE — Progress Notes (Signed)
Can you please contact patient to schedule stress test, please and thank you.

## 2021-11-10 NOTE — Progress Notes (Signed)
Call transferred to front desk staff (AS) to schedule appointment.

## 2021-11-13 ENCOUNTER — Other Ambulatory Visit: Payer: Self-pay

## 2021-11-17 ENCOUNTER — Encounter: Payer: Self-pay | Admitting: Family Medicine

## 2021-11-17 ENCOUNTER — Ambulatory Visit (INDEPENDENT_AMBULATORY_CARE_PROVIDER_SITE_OTHER): Payer: Medicare Other | Admitting: Family Medicine

## 2021-11-17 ENCOUNTER — Ambulatory Visit: Payer: Medicare Other | Admitting: Registered Nurse

## 2021-11-17 VITALS — BP 128/70 | HR 90 | Temp 98.4°F | Ht 76.0 in | Wt 359.0 lb

## 2021-11-17 DIAGNOSIS — I2510 Atherosclerotic heart disease of native coronary artery without angina pectoris: Secondary | ICD-10-CM | POA: Diagnosis not present

## 2021-11-17 DIAGNOSIS — I2699 Other pulmonary embolism without acute cor pulmonale: Secondary | ICD-10-CM

## 2021-11-17 DIAGNOSIS — I2584 Coronary atherosclerosis due to calcified coronary lesion: Secondary | ICD-10-CM | POA: Diagnosis not present

## 2021-11-17 DIAGNOSIS — I10 Essential (primary) hypertension: Secondary | ICD-10-CM | POA: Diagnosis not present

## 2021-11-17 DIAGNOSIS — J4540 Moderate persistent asthma, uncomplicated: Secondary | ICD-10-CM | POA: Diagnosis not present

## 2021-11-17 DIAGNOSIS — J329 Chronic sinusitis, unspecified: Secondary | ICD-10-CM

## 2021-11-17 DIAGNOSIS — R7303 Prediabetes: Secondary | ICD-10-CM

## 2021-11-17 DIAGNOSIS — Z23 Encounter for immunization: Secondary | ICD-10-CM | POA: Diagnosis not present

## 2021-11-17 DIAGNOSIS — J302 Other seasonal allergic rhinitis: Secondary | ICD-10-CM

## 2021-11-17 LAB — HEMOGLOBIN A1C: Hgb A1c MFr Bld: 6.6 % — ABNORMAL HIGH (ref 4.6–6.5)

## 2021-11-17 LAB — COMPREHENSIVE METABOLIC PANEL
ALT: 25 U/L (ref 0–53)
AST: 18 U/L (ref 0–37)
Albumin: 3.8 g/dL (ref 3.5–5.2)
Alkaline Phosphatase: 92 U/L (ref 39–117)
BUN: 14 mg/dL (ref 6–23)
CO2: 29 mEq/L (ref 19–32)
Calcium: 9.8 mg/dL (ref 8.4–10.5)
Chloride: 98 mEq/L (ref 96–112)
Creatinine, Ser: 1.06 mg/dL (ref 0.40–1.50)
GFR: 73.29 mL/min (ref >60.00)
Glucose, Bld: 114 mg/dL — ABNORMAL HIGH (ref 70–99)
Potassium: 4 mEq/L (ref 3.5–5.1)
Sodium: 137 mEq/L (ref 135–145)
Total Bilirubin: 0.7 mg/dL (ref 0.2–1.2)
Total Protein: 7.5 g/dL (ref 6.0–8.3)

## 2021-11-17 LAB — LIPID PANEL
Cholesterol: 154 mg/dL (ref 0–200)
HDL: 40.6 mg/dL (ref >39.00)
LDL Cholesterol: 93 mg/dL (ref 0–99)
NonHDL: 113.3
Total CHOL/HDL Ratio: 4
Triglycerides: 103 mg/dL (ref 0.0–149.0)
VLDL: 20.6 mg/dL (ref 0.0–40.0)

## 2021-11-17 MED ORDER — LISINOPRIL 10 MG PO TABS: 10.0000 mg | ORAL_TABLET | Freq: Every day | ORAL | 1 refills | Status: DC

## 2021-11-17 MED ORDER — AMOXICILLIN-POT CLAVULANATE 875-125 MG PO TABS: 1.0000 | ORAL_TABLET | Freq: Two times a day (BID) | ORAL | 0 refills | Status: DC

## 2021-11-17 MED ORDER — AZELASTINE HCL 0.1 % NA SOLN: 1.0000 | Freq: Two times a day (BID) | NASAL | 12 refills | Status: DC

## 2021-11-17 MED ORDER — FLUTICASONE PROPIONATE 50 MCG/ACT NA SUSP: 1.0000 | Freq: Every day | NASAL | 11 refills | Status: DC

## 2021-11-17 MED ORDER — ALBUTEROL SULFATE HFA 108 (90 BASE) MCG/ACT IN AERS: 2.0000 | INHALATION_SPRAY | Freq: Four times a day (QID) | RESPIRATORY_TRACT | 0 refills | Status: DC | PRN

## 2021-11-17 MED ORDER — FLUTICASONE-SALMETEROL 250-50 MCG/ACT IN AEPB: 1.0000 | INHALATION_SPRAY | Freq: Two times a day (BID) | RESPIRATORY_TRACT | 5 refills | Status: DC

## 2021-11-17 MED ORDER — MONTELUKAST SODIUM 10 MG PO TABS: 10.0000 mg | ORAL_TABLET | Freq: Every day | ORAL | 3 refills | Status: DC

## 2021-11-17 NOTE — Progress Notes (Unsigned)
Subjective:  Patient ID: Matthew Oconnell, male    DOB: [birth year]  Age: 66 y.o. MRN: [id number]  CC:  Chief Complaint  Patient presents with   Medication Refill    Wants to see about getting off the blood thinners, as well as med check   Sinus Problem    Pt thinks he has a sinus infection now   Depression    PHQ9 - 6    HPI Matthew Oconnell presents for  Medication follow-up, other concerns as above, previous patient of Janeece Agee.  Last visit with him in March.  Sinus congestion Has history of recurrent sinus infection. Seen by ENT last year, recommended surgery, but planned on waiting until off blood thinner.  Recently had flare - yellow mucus and drainage, no fever. R cheek and nasal pressure. Past week. Slight improvement past few days, still some symptoms.  Tx: allegra. Atrovent NS.  Out of flonase past month.needs refill.  Has tolerated augmentin prior.   Asthma: Advai 1 puff BID. Working well. Albuterol in past. No need recently.  On allegra, singulair daily.   History of pulmonary embolus Note reviewed from Dr. Francine Graven with pulmonary in January.  Provoked PE. Admitted November 2022.  Treated with Xarelto.  Per pulmonary note in January, plan to treat for 6 months.  Also followed by cardiology.  33-month follow-up for PFTs discussed by Dr. Francine Graven in January. Seen in follow-up with [facility] on April 18 to evaluate for home sleep test, concern for OSA.has not had sleep testing yet.  O2 sat 96-98.  No CP, dyspnea or leg swelling. No prior hx of DVT prior to 01/2021.   Hypertension: Followed by Dr. Odis Hollingshead with cardiology, office visit in January, 6-week follow-up planned.  I do not see a follow-up visit recently. Has stress test in October. [place]  Lisinopril 10mg  qd. Lasix 20mg  BID. Working well. No new side effects with meds.  Home readings: none.  BP Readings from Last 3 Encounters:  11/17/21 128/70  07/20/21 130/70   06/21/21 126/64   Lab Results  Component Value Date   K 4.2 05/17/2021   Lab Results  Component Value Date   CREATININE 0.92 05/17/2021   Prediabetes.  Lab Results  Component Value Date   HGBA1C 6.2 05/17/2021  Losing weight. Plans to increase exercise.  Wt Readings from Last 3 Encounters:  11/17/21 (!) 359 lb (162.8 kg)  07/20/21 (!) 362 lb 9.6 oz (164.5 kg)  06/21/21 (!) 368 lb 3.2 oz (167 kg)     Positive depression screening Discussed feeling down/depressed through the winter at his visit with 06/23/21 in March.  Not on antidepressants or meds for mood at this time.  No SI. Father passed September last year, wife passed in November last year then he was in hospital.  Doing a lot better recently, not feeling like depressed.  No counseling.      11/17/2021   10:21 AM 07/20/2021    1:19 PM 05/17/2021    7:55 AM 02/04/2021    9:39 AM  Depression screen PHQ 2/9  Decreased Interest 1 1 2  0  Down, Depressed, Hopeless 1 2 2 1   PHQ - 2 Score 2 3 4 1   Altered sleeping 1 0 1 2  Tired, decreased energy 1 1 0 1  Change in appetite 0 1 1 0  Feeling bad or failure about yourself  1 1 1  0  Trouble concentrating 0 0 1 0  Moving slowly or fidgety/restless 1 0 1 0  Suicidal thoughts 0 0 0 0  PHQ-9 Score 6 6 9 4   Difficult doing work/chores  Somewhat difficult Somewhat difficult Somewhat difficult     History Patient Active Problem List   Diagnosis Date Noted   Loud snoring 06/21/2021   History of elevated glucose 05/17/2021   Depression, recurrent ([place]) 05/17/2021   Severe sepsis ([place]) 01/28/2021   SOB (shortness of breath) 01/28/2021   Acute pulmonary embolism ([place]) 01/28/2021   Acute asthma exacerbation 01/28/2021   Hyponatremia 01/28/2021   Hypertension    CAP (community acquired pneumonia) 01/27/2021   Body mass index (BMI) 45.0-49.9, adult ([place]) 10/11/2020   Allergic rhinitis due to pollen 10/11/2020   COVID-19 virus infection 10/11/2020   Elevated blood-pressure  reading without diagnosis of hypertension 10/11/2020   Exposure to confirmed case of COVID-19 10/11/2020   History of gout 10/11/2020   Gout 10/11/2020   Impaired fasting glucose 10/11/2020   Mild persistent asthma with (acute) exacerbation 10/11/2020   Personal history of colonic polyps 10/11/2020   Prediabetes 10/11/2020   Past Medical History:  Diagnosis Date   Asthmatic bronchitis with acute exacerbation    Cellulitis    Coronary artery calcification    Ganglion cyst    Of Left Hand   Gout    Hypertension    Hyperuricemia    Leg swelling    Mild depression    Mild persistent asthma    Morbid obesity (HCC)    Osteoarthritis    Prediabetes    Pulmonary embolism (HCC)    Recurrent sinusitis    Redness    Bilateral lower extremities   Stasis dermatitis of both legs    Venous insufficiency    Past Surgical History:  Procedure Laterality Date   COLONOSCOPY N/A 05/04/2008   ganglion cyst removed from L hand Left    Right hand repair for a compound break Right    Allergies  Allergen Reactions   Cefuroxime Rash   Sulfamethoxazole-Trimethoprim Rash   Prior to Admission medications   Medication Sig Start Date End Date Taking? Authorizing Provider  azelastine (ASTELIN) 0.1 % nasal spray Place 1 spray into both nostrils 2 (two) times daily. Use in each nostril as directed 02/09/21  Yes Maximiano Coss, NP  fexofenadine (ALLEGRA) 180 MG tablet Take 180 mg by mouth daily as needed for allergies or rhinitis.   Yes [provider]  fluticasone-salmeterol (ADVAIR) 250-50 MCG/ACT AEPB Inhale 1 puff into the lungs 2 (two) times daily. 02/04/21  Yes Maximiano Coss, NP  furosemide (LASIX) 20 MG tablet Take 1 tablet (20 mg total) by mouth 2 (two) times daily. 09/20/21  Yes Maximiano Coss, NP  lisinopril (ZESTRIL) 10 MG tablet Take 1 tablet by mouth once daily 09/08/21  Yes Maximiano Coss, NP  montelukast (SINGULAIR) 10 MG tablet Take 1 tablet (10 mg total) by mouth daily.  02/04/21  Yes Maximiano Coss, NP  predniSONE (DELTASONE) 10 MG tablet 3 tabs x3 days and then 2 tabs x3 days and then 1 tab x3 days.  Take w/ food. 07/20/21  Yes Midge Minium, MD  XARELTO 20 MG TABS tablet TAKE 1 TABLET BY MOUTH ONCE DAILY WITH SUPPER 08/12/21  Yes Maximiano Coss, NP  acetaminophen (TYLENOL) 500 MG tablet Take 1,000 mg by mouth every 6 (six) hours as needed for moderate pain, headache or fever. Patient not taking: Reported on 11/17/2021    [provider]  fluticasone (FLONASE) 50 MCG/ACT nasal spray  2 sprays daily as needed for allergies. Patient not taking: Reported on 11/17/2021    [provider]  ipratropium (ATROVENT) 0.03 % nasal spray Place 2 sprays into both nostrils daily as needed for rhinitis. Patient not taking: Reported on 11/17/2021 02/04/21   Maximiano Coss, NP  RIVAROXABAN Alveda Reasons) VTE STARTER PACK (15 & 20 MG) Take one 15mg  tablet by mouth twice a day until 02/17/21. On 02/18/21, switch to one 20mg  tablet once a day. Take with food. Patient not taking: Reported on 07/20/2021 01/28/21   Domenic Polite, MD   Social History   Socioeconomic History   Marital status: Widowed    Spouse name: Not on file   Number of children: 4   Years of education: Not on file   Highest education level: Not on file  Occupational History   Not on file  Tobacco Use   Smoking status: Former    Packs/day: 1.00    Years: 20.00    Total pack years: 20.00    Types: Cigarettes    Quit date: 2015    Years since quitting: 8.7   Smokeless tobacco: Never  Vaping Use   Vaping Use: Never used  Substance and Sexual Activity   Alcohol use: Yes    Comment: occasionally   Drug use: Not Currently   Sexual activity: Not on file  Other Topics Concern   Not on file  Social History Narrative   Not on file   Social Determinants of Health   Financial Resource Strain: Not on file  Food Insecurity: Not on file  Transportation Needs: Not on file  Physical  Activity: Not on file  Stress: Not on file  Social Connections: Not on file  Intimate Partner Violence: Not on file    Review of Systems Per HPI  Objective:   Vitals:   11/17/21 1024  BP: 128/70  Pulse: 90  Temp: 98.4 F (36.9 C)  SpO2: 96%  Weight: (!) 359 lb (162.8 kg)  Height: 6\' 4"  (1.93 m)     Physical Exam Vitals reviewed.  Constitutional:      Appearance: He is well-developed.  HENT:     Head: Normocephalic and atraumatic.     Right Ear: Tympanic membrane, ear canal and external ear normal.     Left Ear: Tympanic membrane, ear canal and external ear normal.     Nose: No rhinorrhea.     Comments: Pressure sensation of the right maxillary sinus.  Otherwise nontender.    Mouth/Throat:     Pharynx: No oropharyngeal exudate or posterior oropharyngeal erythema.  Eyes:     Conjunctiva/sclera: Conjunctivae normal.     Pupils: Pupils are equal, round, and reactive to light.  Neck:     Vascular: No carotid bruit or JVD.  Cardiovascular:     Rate and Rhythm: Normal rate and regular rhythm.     Heart sounds: Normal heart sounds. No murmur heard. Pulmonary:     Effort: Pulmonary effort is normal.     Breath sounds: Wheezing (Faint end expiratory wheeze at right base.  Normal effort otherwise.  Speaking in full sentences.  No distress.) present. No rhonchi or rales.  Abdominal:     Palpations: Abdomen is soft.     Tenderness: There is no abdominal tenderness.  Musculoskeletal:     Cervical back: Neck supple.     Right lower leg: No edema.     Left lower leg: No edema.     Comments: Calves nontender, negative Homans.  Lymphadenopathy:  Cervical: No cervical adenopathy.  Skin:    General: Skin is warm and dry.     Findings: No rash.  Neurological:     Mental Status: He is alert and oriented to person, place, and time.  Psychiatric:        Mood and Affect: Mood normal.        Behavior: Behavior normal.     41 minutes spent during visit, including chart  review and review of plan for previous pulmonary embolus, specialist follow-up, counseling and assimilation of information, exam, discussion of plan, and chart completion.     Assessment & Plan:  Matthew Oconnell is a 66 y.o. male . Primary hypertension - Plan: Comprehensive metabolic panel Essential hypertension - Plan: lisinopril (ZESTRIL) 10 MG tablet  -Stable on current med regimen, continues lisinopril same dose, check labs.  Cardiology follow-up commended.  Continue Lasix same dose.  Need for influenza vaccination - Plan: Flu Vaccine QUAD High Dose(Fluad)  Acute pulmonary embolism, unspecified pulmonary embolism type, unspecified whether acute cor pulmonale present ([place])  -Status post over 6 months of treatment for PE,.  Based on history above and review of notes from specialist, we will stop Xarelto at this time.  ER/RTC precautions given.  Moderate persistent asthma without complication - Plan: fluticasone-salmeterol (ADVAIR) 250-50 MCG/ACT AEPB, montelukast (SINGULAIR) 10 MG tablet, albuterol (VENTOLIN HFA) 108 (90 Base) MCG/ACT inhaler  -Faint wheeze noted as above, continue Advair, albuterol if needed, Singulair, follow-up with pulmonary, as appears to be due for PFTs.  RTC precautions if increased albuterol needed or worsening  symptoms.  -Sleep test also pending.  Seasonal allergic rhinitis, unspecified trigger - Plan: azelastine (ASTELIN) 0.1 % nasal spray, fluticasone (FLONASE) 50 MCG/ACT nasal spray Recurrent sinus infections - Plan: amoxicillin-clavulanate (AUGMENTIN) 875-125 MG tablet  Refill Astelin nasal spray, restart Flonase for allergies.  -Saline nasal spray for sinus symptoms, possible subacute sinusitis.  If not improving, start Augmentin, and follow-up with ENT if persistent symptoms/recurrence.   Prediabetes - Plan: Lipid panel, Hemoglobin A1c Check labs, no new meds for now.  Watch diet, exercise.  Meds ordered this encounter  Medications    amoxicillin-clavulanate (AUGMENTIN) 875-125 MG tablet    Sig: Take 1 tablet by mouth 2 (two) times daily.    Dispense:  20 tablet    Refill:  0   lisinopril (ZESTRIL) 10 MG tablet    Sig: Take 1 tablet (10 mg total) by mouth daily.    Dispense:  90 tablet    Refill:  1   fluticasone-salmeterol (ADVAIR) 250-50 MCG/ACT AEPB    Sig: Inhale 1 puff into the lungs 2 (two) times daily.    Dispense:  60 each    Refill:  5   azelastine (ASTELIN) 0.1 % nasal spray    Sig: Place 1 spray into both nostrils 2 (two) times daily. Use in each nostril as directed    Dispense:  30 mL    Refill:  12   montelukast (SINGULAIR) 10 MG tablet    Sig: Take 1 tablet (10 mg total) by mouth daily.    Dispense:  90 tablet    Refill:  3   fluticasone (FLONASE) 50 MCG/ACT nasal spray    Sig: Place 1-2 sprays into both nostrils daily.    Dispense:  16 g    Refill:  11   albuterol (VENTOLIN HFA) 108 (90 Base) MCG/ACT inhaler    Sig: Inhale 2 puffs into the lungs every 6 (six) hours as needed for wheezing  or shortness of breath.    Dispense:  8 g    Refill:  0   Patient Instructions  You appear to be overdue for pulmonary follow up - please call Dr. Francine Graven for follow up and for pulmonary function test.  I will refill the advair, continue same dose. Albuterol only if needed for asthma flare.   Stop xarelto as you have completed 6 months of treatment. I will refill meds today, but you may also need a follow up appointment with Dr. Odis Hollingshead. Clarify with cardiology.   Saline nasal spray and restart of other nasal spray may help sinus symptoms.  If continued discolored nasal discharge, sinus pain and not improving in the next few days, can start Augmentin as we discussed.  Follow-up with your ear nose and throat specialist for further treatment options.  No other medication changes today, I will check some lab work.    Follow-up with new primary care provider as we discussed but let me know if there are questions in the  meantime.  Take care!    Signed,   Meredith Staggers, MD [facility], [facility] 11/17/21 10:49 AM

## 2021-11-17 NOTE — Patient Instructions (Addendum)
You appear to be overdue for pulmonary follow up - please call Dr. Francine Graven for follow up and for pulmonary function test.  I will refill the advair, continue same dose. Albuterol only if needed for asthma flare.   Stop xarelto as you have completed 6 months of treatment. I will refill meds today, but you may also need a follow up appointment with Dr. Odis Hollingshead. Clarify with cardiology.   Saline nasal spray and restart of other nasal spray may help sinus symptoms.  If continued discolored nasal discharge, sinus pain and not improving in the next few days, can start Augmentin as we discussed.  Follow-up with your ear nose and throat specialist for further treatment options.  No other medication changes today, I will check some lab work.    Follow-up with new primary care provider as we discussed but let me know if there are questions in the meantime.  Take care!

## 2021-11-22 ENCOUNTER — Encounter: Payer: Self-pay | Admitting: Family Medicine

## 2021-12-01 ENCOUNTER — Ambulatory Visit (INDEPENDENT_AMBULATORY_CARE_PROVIDER_SITE_OTHER): Payer: Medicare Other | Admitting: Physician Assistant

## 2021-12-01 ENCOUNTER — Encounter: Payer: Self-pay | Admitting: Physician Assistant

## 2021-12-01 VITALS — BP 148/79 | HR 84 | Temp 96.8°F | Ht 76.0 in | Wt 364.4 lb

## 2021-12-01 DIAGNOSIS — Z6841 Body Mass Index (BMI) 40.0 and over, adult: Secondary | ICD-10-CM

## 2021-12-01 DIAGNOSIS — I10 Essential (primary) hypertension: Secondary | ICD-10-CM

## 2021-12-01 DIAGNOSIS — J4540 Moderate persistent asthma, uncomplicated: Secondary | ICD-10-CM | POA: Insufficient documentation

## 2021-12-01 DIAGNOSIS — E1165 Type 2 diabetes mellitus with hyperglycemia: Secondary | ICD-10-CM | POA: Diagnosis not present

## 2021-12-01 NOTE — Patient Instructions (Signed)
Welcome to [facility]! It was a pleasure meeting you today.  As discussed, Please schedule a 3 month follow up visit today.  Referral to nutritionist  Keep working on lifestyle goals - you can do it!   SLOWLY increase exercise to avoid injury.   PLEASE NOTE:  If you had any LAB tests please let us know if you have not heard back within a few days. You may see your results on MyChart before we have a chance to review them but we will give you a call once they are reviewed by [place]. If we ordered any REFERRALS today, please let us know if you have not heard from their office within the next two weeks. Let us know through MyChart if you are needing REFILLS, or have your pharmacy send [place] the request. You can also use MyChart to communicate with me or any office staff.  Please try these tips to maintain a healthy lifestyle:  Eat most of your calories during the day when you are active. Eliminate processed foods including packaged sweets (pies, cakes, cookies), reduce intake of potatoes, white bread, white pasta, and white rice. Look for whole grain options, oat flour or almond flour.  Each meal should contain half fruits/vegetables, one quarter protein, and one quarter carbs (no bigger than a computer mouse).  Cut down on sweet beverages. This includes juice, soda, and sweet tea. Also watch fruit intake, though this is a healthier sweet option, it still contains natural sugar! Limit to 3 servings daily.  Drink at least 1 glass of water with each meal and aim for at least 8 glasses (64 ounces) per day.  Exercise at least 150 minutes every week to the best of your ability.    Take Care,  Coben Godshall, PA-C

## 2021-12-01 NOTE — Assessment & Plan Note (Signed)
Very motivated for changes.  He is going to work on this at at home on his own.  He would like to lose at least 15 pounds by his next visit.  He will let me know if he needs any additional help.

## 2021-12-01 NOTE — Assessment & Plan Note (Signed)
Slight flareup in symptoms.  He continues to use Astelin nasal spray, Allegra, Flonase, Advair, Singulair daily.  He will let me know if he needs any additional help.  Declines need for prednisone at this time.

## 2021-12-01 NOTE — Assessment & Plan Note (Signed)
Slightly elevated blood pressure today.  He continues to work with his cardiologist.  He is taking lisinopril 10 mg.  He will continue to monitor readings at home.  He continues to work on lifestyle changes.

## 2021-12-01 NOTE — Assessment & Plan Note (Signed)
Lab Results  Component Value Date   HGBA1C 6.6 (H) 11/17/2021   Recent A1c is just over 6.5%.  Wants to continue lifestyle modifications at this time.  Plan to get him in with a nutritionist for additional help.

## 2021-12-01 NOTE — Progress Notes (Signed)
Subjective:    Patient ID: Matthew Oconnell, male    DOB: 16-Oct-1955, 66 y.o.   MRN: [id number]  Chief Complaint  Patient presents with   Transfer of care    Weight loss    HPI Patient is in today for Matthew Oconnell from Janeece Agee, NP.   Specialists - follows with cardiology - stress test; following on PE last year after long car ride; currently working with ENT as well on chronic sinus issues   Weight concerns -  States a lot of weight gain over the last year.  Watching diet, avoiding alcohol, starting to ease back into working out. Goal is to get at least 120 lbs off.  Used to be in [employer] and could work out without difficulty. States he is using [employer] at home. Has gone through a lot of loss in the last year including his father and wife; still grieving process.   Past Medical History:  Diagnosis Date   Asthmatic bronchitis with acute exacerbation    Cellulitis    Coronary artery calcification    Ganglion cyst    Of Left Hand   Gout    Hypertension    Hyperuricemia    Leg swelling    Mild depression    Mild persistent asthma    Morbid obesity (HCC)    Osteoarthritis    Prediabetes    Pulmonary embolism (HCC)    Recurrent sinusitis    Redness    Bilateral lower extremities   Stasis dermatitis of both legs    Venous insufficiency     Past Surgical History:  Procedure Laterality Date   COLONOSCOPY N/A 05/04/2008   ganglion cyst removed from L hand Left    Right hand repair for a compound break Right     Family History  Problem Relation Age of Onset   Other Father        sepsis   Prostate cancer Brother     Social History   Tobacco Use   Smoking status: Former    Packs/day: 1.00    Years: 20.00    Total pack years: 20.00    Types: Cigarettes    Quit date: 2015    Years since quitting: 8.7   Smokeless tobacco: Never  Vaping Use   Vaping Use: Never used  Substance Use Topics   Alcohol use: Yes    Comment: occasionally   Drug use: Not Currently      Allergies  Allergen Reactions   Cefuroxime Rash   Sulfamethoxazole-Trimethoprim Rash    Review of Systems NEGATIVE UNLESS OTHERWISE INDICATED IN HPI      Objective:     BP (!) 148/79 (BP Location: Left Arm, Patient Position: Sitting, Cuff Size: Large)   Pulse 84   Temp (!) 96.8 F (36 C) (Temporal)   Ht 6\' 4"  (1.93 m)   Wt (!) 364 lb 6 oz (165.3 kg)   SpO2 92%   BMI 44.35 kg/m   Wt Readings from Last 3 Encounters:  12/01/21 (!) 364 lb 6 oz (165.3 kg)  11/17/21 (!) 359 lb (162.8 kg)  07/20/21 (!) 362 lb 9.6 oz (164.5 kg)    BP Readings from Last 3 Encounters:  12/01/21 (!) 148/79  11/17/21 128/70  07/20/21 130/70     Physical Exam Constitutional:      Appearance: Normal appearance. He is obese.  Cardiovascular:     Rate and Rhythm: Normal rate and regular rhythm.     Pulses: Normal pulses.  Heart sounds: Normal heart sounds. No murmur heard. Pulmonary:     Effort: Pulmonary effort is normal.     Breath sounds: Wheezing (scattered faint diffuse wheeze) present.  Skin:    General: Skin is warm.     Findings: No rash.  Neurological:     Mental Status: He is alert.     Cranial Nerves: No cranial nerve deficit.  Psychiatric:        Mood and Affect: Mood normal.        Behavior: Behavior normal.        Assessment & Plan:  Type 2 diabetes mellitus with hyperglycemia, without long-term current use of insulin (HCC) Assessment & Plan: Lab Results  Component Value Date   HGBA1C 6.6 (H) 11/17/2021   Recent A1c is just over 6.5%.  Wants to continue lifestyle modifications at this time.  Plan to get him in with a nutritionist for additional help.  Orders: -     Amb Referral to Nutrition and Diabetic Education  Primary hypertension Assessment & Plan: Slightly elevated blood pressure today.  He continues to work with his cardiologist.  He is taking lisinopril 10 mg.  He will continue to monitor readings at home.  He continues to work on lifestyle  changes.   Moderate persistent asthma without complication Assessment & Plan: Slight flareup in symptoms.  He continues to use Astelin nasal spray, Allegra, Flonase, Advair, Singulair daily.  He will let me know if he needs any additional help.  Declines need for prednisone at this time.   BMI 40.0-44.9, adult [facility]) Assessment & Plan: Very motivated for changes.  He is going to work on this at at home on his own.  He would like to lose at least 15 pounds by his next visit.  He will let me know if he needs any additional help.         Return in about 3 months (around 03/02/2022) for fasting labs, weight and BP recheck .  This note was prepared with assistance of Systems analyst. Occasional wrong-word or sound-a-like substitutions may have occurred due to the inherent limitations of voice recognition software.    Taegen Lennox M Sharley Keeler, PA-C

## 2021-12-02 ENCOUNTER — Other Ambulatory Visit: Payer: Self-pay | Admitting: Family Medicine

## 2021-12-02 DIAGNOSIS — J4540 Moderate persistent asthma, uncomplicated: Secondary | ICD-10-CM

## 2021-12-06 ENCOUNTER — Other Ambulatory Visit: Payer: Self-pay

## 2021-12-06 DIAGNOSIS — I2699 Other pulmonary embolism without acute cor pulmonale: Secondary | ICD-10-CM

## 2021-12-07 ENCOUNTER — Emergency Department (HOSPITAL_BASED_OUTPATIENT_CLINIC_OR_DEPARTMENT_OTHER): Payer: Medicare Other

## 2021-12-07 ENCOUNTER — Encounter: Payer: Self-pay | Admitting: Physician Assistant

## 2021-12-07 ENCOUNTER — Ambulatory Visit (INDEPENDENT_AMBULATORY_CARE_PROVIDER_SITE_OTHER): Payer: Medicare Other | Admitting: Physician Assistant

## 2021-12-07 ENCOUNTER — Encounter (HOSPITAL_BASED_OUTPATIENT_CLINIC_OR_DEPARTMENT_OTHER): Payer: Self-pay

## 2021-12-07 ENCOUNTER — Emergency Department (HOSPITAL_BASED_OUTPATIENT_CLINIC_OR_DEPARTMENT_OTHER)
Admission: EM | Admit: 2021-12-07 | Discharge: 2021-12-07 | Disposition: A | Discharge status: Home / Self Care | Payer: Medicare Other | Attending: Emergency Medicine | Admitting: Emergency Medicine

## 2021-12-07 ENCOUNTER — Other Ambulatory Visit (HOSPITAL_BASED_OUTPATIENT_CLINIC_OR_DEPARTMENT_OTHER): Payer: Self-pay

## 2021-12-07 ENCOUNTER — Other Ambulatory Visit: Payer: Self-pay

## 2021-12-07 ENCOUNTER — Emergency Department (HOSPITAL_BASED_OUTPATIENT_CLINIC_OR_DEPARTMENT_OTHER): Payer: Medicare Other | Admitting: Radiology

## 2021-12-07 VITALS — BP 130/79 | HR 112 | Temp 97.8°F | Resp 23 | Ht 76.0 in | Wt 353.6 lb

## 2021-12-07 DIAGNOSIS — R0602 Shortness of breath: Secondary | ICD-10-CM | POA: Insufficient documentation

## 2021-12-07 DIAGNOSIS — R000 Tachycardia, unspecified: Secondary | ICD-10-CM

## 2021-12-07 DIAGNOSIS — R82998 Other abnormal findings in urine: Secondary | ICD-10-CM | POA: Diagnosis present

## 2021-12-07 DIAGNOSIS — Z1211 Encounter for screening for malignant neoplasm of colon: Secondary | ICD-10-CM

## 2021-12-07 DIAGNOSIS — Z86711 Personal history of pulmonary embolism: Secondary | ICD-10-CM

## 2021-12-07 DIAGNOSIS — N3001 Acute cystitis with hematuria: Secondary | ICD-10-CM | POA: Diagnosis not present

## 2021-12-07 DIAGNOSIS — M791 Myalgia, unspecified site: Secondary | ICD-10-CM | POA: Insufficient documentation

## 2021-12-07 DIAGNOSIS — Z20822 Contact with and (suspected) exposure to covid-19: Secondary | ICD-10-CM | POA: Insufficient documentation

## 2021-12-07 DIAGNOSIS — R432 Parageusia: Secondary | ICD-10-CM | POA: Diagnosis not present

## 2021-12-07 DIAGNOSIS — R110 Nausea: Secondary | ICD-10-CM | POA: Insufficient documentation

## 2021-12-07 DIAGNOSIS — I10 Essential (primary) hypertension: Secondary | ICD-10-CM | POA: Diagnosis not present

## 2021-12-07 DIAGNOSIS — Z79899 Other long term (current) drug therapy: Secondary | ICD-10-CM | POA: Insufficient documentation

## 2021-12-07 DIAGNOSIS — Z7951 Long term (current) use of inhaled steroids: Secondary | ICD-10-CM | POA: Diagnosis not present

## 2021-12-07 DIAGNOSIS — J45909 Unspecified asthma, uncomplicated: Secondary | ICD-10-CM | POA: Diagnosis not present

## 2021-12-07 LAB — CBC WITH DIFFERENTIAL/PLATELET
Abs Immature Granulocytes: 0.08 10*3/uL — ABNORMAL HIGH (ref 0.00–0.07)
Basophils Absolute: 0 10*3/uL (ref 0.0–0.1)
Basophils Relative: 0 %
Eosinophils Absolute: 0 10*3/uL (ref 0.0–0.5)
Eosinophils Relative: 0 %
HCT: 45.5 % (ref 39.0–52.0)
Hemoglobin: 15.8 g/dL (ref 13.0–17.0)
Immature Granulocytes: 1 %
Lymphocytes Relative: 8 %
Lymphs Abs: 0.9 10*3/uL (ref 0.7–4.0)
MCH: 32.2 pg (ref 26.0–34.0)
MCHC: 34.7 g/dL (ref 30.0–36.0)
MCV: 92.7 fL (ref 80.0–100.0)
Monocytes Absolute: 0.9 10*3/uL (ref 0.1–1.0)
Monocytes Relative: 8 %
Neutro Abs: 9.6 10*3/uL — ABNORMAL HIGH (ref 1.7–7.7)
Neutrophils Relative %: 83 %
Platelets: 207 10*3/uL (ref 150–400)
RBC: 4.91 MIL/uL (ref 4.22–5.81)
RDW: 13.2 % (ref 11.5–15.5)
WBC: 11.6 10*3/uL — ABNORMAL HIGH (ref 4.0–10.5)
nRBC: 0 % (ref 0.0–0.2)

## 2021-12-07 LAB — URINALYSIS, ROUTINE W REFLEX MICROSCOPIC
Bilirubin Urine: NEGATIVE
Glucose, UA: NEGATIVE mg/dL
Ketones, ur: 15 mg/dL — AB
Nitrite: POSITIVE — AB
Protein, ur: 30 mg/dL — AB
Specific Gravity, Urine: 1.024 (ref 1.005–1.030)
WBC, UA: >50 WBC/hpf — ABNORMAL HIGH (ref 0–5)
pH: 6 (ref 5.0–8.0)

## 2021-12-07 LAB — BRAIN NATRIURETIC PEPTIDE: B Natriuretic Peptide: 27.6 pg/mL (ref 0.0–100.0)

## 2021-12-07 LAB — COMPREHENSIVE METABOLIC PANEL
ALT: 22 U/L (ref 0–44)
AST: 18 U/L (ref 15–41)
Albumin: 3.9 g/dL (ref 3.5–5.0)
Alkaline Phosphatase: 70 U/L (ref 38–126)
Anion gap: 11 (ref 5–15)
BUN: 10 mg/dL (ref 8–23)
CO2: 22 mmol/L (ref 22–32)
Calcium: 9.2 mg/dL (ref 8.9–10.3)
Chloride: 98 mmol/L (ref 98–111)
Creatinine, Ser: 1.03 mg/dL (ref 0.61–1.24)
GFR, Estimated: >60 mL/min (ref >60)
Glucose, Bld: 145 mg/dL — ABNORMAL HIGH (ref 70–99)
Potassium: 3.9 mmol/L (ref 3.5–5.1)
Sodium: 131 mmol/L — ABNORMAL LOW (ref 135–145)
Total Bilirubin: 0.9 mg/dL (ref 0.3–1.2)
Total Protein: 7.1 g/dL (ref 6.5–8.1)

## 2021-12-07 LAB — LACTIC ACID, PLASMA
Lactic Acid, Venous: 1.4 mmol/L (ref 0.5–1.9)
Lactic Acid, Venous: 1.1 mmol/L (ref 0.5–1.9)

## 2021-12-07 LAB — TROPONIN I (HIGH SENSITIVITY)
Troponin I (High Sensitivity): 16 ng/L (ref <18)
Troponin I (High Sensitivity): 16 ng/L (ref <18)

## 2021-12-07 LAB — RESP PANEL BY RT-PCR (FLU A&B, COVID) ARPGX2
Influenza A by PCR: NEGATIVE
Influenza B by PCR: NEGATIVE
SARS Coronavirus 2 by RT PCR: NEGATIVE

## 2021-12-07 LAB — D-DIMER, QUANTITATIVE: D-Dimer, Quant: 1.26 ug/mL-FEU — ABNORMAL HIGH (ref 0.00–0.50)

## 2021-12-07 LAB — POC COVID19 BINAXNOW: SARS Coronavirus 2 Ag: NEGATIVE

## 2021-12-07 MED ORDER — CIPROFLOXACIN HCL 500 MG PO TABS
500.0000 mg | ORAL_TABLET | Freq: Two times a day (BID) | ORAL | 0 refills | Status: DC
  Filled 2021-12-07: qty 20, 10d supply, fill #0

## 2021-12-07 MED ORDER — SODIUM CHLORIDE 0.9 % IV BOLUS
1000.0000 mL | Freq: Once | INTRAVENOUS | Status: AC
  Administered 2021-12-07: 1000 mL via INTRAVENOUS

## 2021-12-07 MED ORDER — CIPROFLOXACIN HCL 500 MG PO TABS
500.0000 mg | ORAL_TABLET | Freq: Once | ORAL | Status: AC
  Administered 2021-12-07: 500 mg via ORAL
  Filled 2021-12-07: qty 1

## 2021-12-07 MED ORDER — ACETAMINOPHEN 325 MG PO TABS
650.0000 mg | ORAL_TABLET | Freq: Once | ORAL | Status: AC
  Administered 2021-12-07: 650 mg via ORAL
  Filled 2021-12-07: qty 2

## 2021-12-07 MED ORDER — IOHEXOL 350 MG/ML SOLN
100.0000 mL | Freq: Once | INTRAVENOUS | Status: AC | PRN
  Administered 2021-12-07: 100 mL via INTRAVENOUS

## 2021-12-07 NOTE — Progress Notes (Signed)
Subjective:    Patient ID: Matthew Oconnell, male    DOB: 11-12-1955, 66 y.o.   MRN: [id number]  Chief Complaint  Patient presents with   Urinary Tract Infection    Pt c/o possible UTI, pt says been unable to eat since Sunday, no taste, hard to breathe, cough, runny nose, chest congestion, urine has odor, pt states having urinary urgency but little output, unable to give urine sample;     Urinary Tract Infection    Patient is in today for a multitude of symptoms.  Patient states that in the last few days he has developed dysuria, decrease in urination, urinary urgency, poor taste, decreased appetite, chest congestion and cough.  He is feeling very fatigued.  He is feeling very short of breath today.  He does not have any chest pain.  He is sweating, which just started today.  Of note, patient was admitted to the hospital November 2022 for sepsis, CAP, SOB, acute PE.  Past Medical History:  Diagnosis Date   Asthmatic bronchitis with acute exacerbation    Cellulitis    Coronary artery calcification    Ganglion cyst    Of Left Hand   Gout    Hypertension    Hyperuricemia    Leg swelling    Mild depression    Mild persistent asthma    Morbid obesity (HCC)    Osteoarthritis    Prediabetes    Pulmonary embolism (HCC)    Recurrent sinusitis    Redness    Bilateral lower extremities   Stasis dermatitis of both legs    Venous insufficiency     Past Surgical History:  Procedure Laterality Date   COLONOSCOPY N/A 05/04/2008   ganglion cyst removed from L hand Left    Right hand repair for a compound break Right     Family History  Problem Relation Age of Onset   Other Father        sepsis   Prostate cancer Brother     Social History   Tobacco Use   Smoking status: Former    Packs/day: 1.00    Years: 20.00    Total pack years: 20.00    Types: Cigarettes    Quit date: 2015    Years since quitting: 8.7   Smokeless tobacco: Never  Vaping Use   Vaping Use: Never used   Substance Use Topics   Alcohol use: Yes    Comment: occasionally   Drug use: Not Currently     Allergies  Allergen Reactions   Cefuroxime Rash   Sulfamethoxazole-Trimethoprim Rash    Review of Systems NEGATIVE UNLESS OTHERWISE INDICATED IN HPI      Objective:     BP 130/79 (BP Location: Left Arm)   Pulse (!) 112   Temp 97.8 F (36.6 C) (Temporal)   Resp (!) 23   Ht 6\' 4"  (1.93 m)   Wt (!) 353 lb 9.6 oz (160.4 kg)   SpO2 94%   BMI 43.04 kg/m   Wt Readings from Last 3 Encounters:  12/07/21 (!) 353 lb 9.6 oz (160.4 kg)  12/01/21 (!) 364 lb 6 oz (165.3 kg)  11/17/21 (!) 359 lb (162.8 kg)    BP Readings from Last 3 Encounters:  12/07/21 130/79  12/01/21 (!) 148/79  11/17/21 128/70     Physical Exam Vitals and nursing note reviewed.  Constitutional:      General: He is not in acute distress.    Appearance: Normal appearance. He is  obese. He is ill-appearing and diaphoretic. He is not toxic-appearing.  HENT:     Head: Normocephalic and atraumatic.     Right Ear: Tympanic membrane, ear canal and external ear normal.     Left Ear: Tympanic membrane, ear canal and external ear normal.     Nose: Nose normal.     Mouth/Throat:     Mouth: Mucous membranes are moist.     Pharynx: Oropharynx is clear.  Eyes:     Extraocular Movements: Extraocular movements intact.     Conjunctiva/sclera: Conjunctivae normal.     Pupils: Pupils are equal, round, and reactive to light.  Cardiovascular:     Rate and Rhythm: Regular rhythm. Tachycardia present.     Pulses: Normal pulses.     Heart sounds: Normal heart sounds. No murmur heard. Pulmonary:     Effort: Pulmonary effort is normal. Tachypnea present.     Breath sounds: Normal breath sounds.  Musculoskeletal:        General: Normal range of motion.     Cervical back: Normal range of motion and neck supple.     Right lower leg: No edema.     Left lower leg: No edema.  Skin:    General: Skin is warm.  Neurological:      General: No focal deficit present.     Mental Status: He is alert and oriented to person, place, and time.  Psychiatric:        Mood and Affect: Mood normal.        Behavior: Behavior normal.        Assessment & Plan:  Shortness of breath  Sinus tachycardia  History of pulmonary embolus (PE)  Loss of taste -     POC COVID-19 BinaxNow  Screening for colon cancer -     Cologuard    Pt unable to obtain urine in office today. POC COVID test negative, question validity as he does have several symptoms c/w COVID including loss of taste. Given his tachycardia, tachypnea, SOB complaint, hx of PE and respiratory failure in past, I would feel most comfortable with full work-up at nearest ED. Pt agreeable and understanding. He wants to drive himself. Informed will call ahead of his arrival.   This note was prepared with assistance of Dragon voice recognition software. Occasional wrong-word or sound-a-like substitutions may have occurred due to the inherent limitations of voice recognition software.     Nycole Kawahara M Andrina Locken, PA-C

## 2021-12-07 NOTE — ED Notes (Signed)
Patient arrived with CC of shortness of breath. Patient was seen by PCP with concerns of PE and negative POC COVID; patient has a history of PE in the past. Per triage note, patient took a "road trip to [place] without stopping for gas." Patient has a further history of asthma, HTN, and a former smoker. Patient endorses occasional shob with exertion and that this has been an ongoing issue. Denies current shob is much different from his baseline. BBS diminished and no obvious adventitious breath sounds on ascultation in triage. Patient has a productive cough with yellow mucus and congestion. No obvious respiratory distress noted at the time of RT assessment.

## 2021-12-07 NOTE — Patient Instructions (Signed)
Please proceed to [facility] ED right now for further evaluation.

## 2021-12-07 NOTE — Discharge Instructions (Signed)
You are seen today in the emergency department for shortness of breath and foul-smelling urine.  You have a urinary tract infection, take the ciprofloxacin as prescribed twice daily for 10 days.  Follow-up with your primary in a week for reevaluation.  As discussed, the results of your PE study are grossly unchanged compared to previous.  Continue following up with your cardiologist, results copied below for your reference.  Return to the ED for chest pain, inability to eat or drink without vomiting or new concerning symptoms.   IMPRESSION:  1. No evidence of central pulmonary emboli on this suboptimal study.  2. 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  3. Unchanged 4.1 cm ascending aortic aneurysm. Recommend annual  imaging followup by CTA or MRA. This recommendation follows 2010  ACCF/AHA/AATS/ACR/ASA/SCA/SCAI/SIR/STS/SVM Guidelines for the  Diagnosis and Management of Patients with Thoracic Aortic Disease.  Circulation. 2010; 121: V013-H438. Aortic aneurysm NOS (ICD10-I71.9)  4. Hepatic steatosis.

## 2021-12-07 NOTE — ED Notes (Signed)
Called lab to add urine culture.  

## 2021-12-07 NOTE — ED Notes (Signed)
Pt given urinal and advised that urine sample may be needed after pt advised that he has had some malodorous urine.

## 2021-12-07 NOTE — ED Triage Notes (Signed)
Pt states since Saturday, he has not felt well. Pt states that he thought he had a UTI. Pt states he also had shortness of breath.   Pt states he had a blood clot last year, PCP was concerned for another. Pt also did a road trip to [place] without stopping for gas. Pt d/c'd from [place] on 9/28.   Pt states productive cough with mucous. Pt does have congestion.

## 2021-12-07 NOTE — ED Provider Notes (Signed)
[facility] EMERGENCY DEPT Provider Note   CSN: [id number] Arrival date & time: 12/07/21  1219     History  Chief Complaint  Patient presents with   Shortness of Breath    Matthew Oconnell is a 66 y.o. male.   Shortness of Breath    Patient with medical history of hypertension, hyperlipidemia, previous PE no longer on Xarelto as of 12/01/2021, asthma, CAP sent today due to shortness of breath and generalized illness x4 days.  Started on Sunday, he initially felt sick like a viral infection.  Denies cough but was having generalized body aches, fevers and nausea without vomiting or diarrhea.  Started feeling more shortness of breath which is worse with any exertion.  Denies any specific chest pain, endorses foul smell to his urine but no dysuria or hematuria.  Not having any abdominal pain.  Home Medications Prior to Admission medications   Medication Sig Start Date End Date Taking? Authorizing Provider  ciprofloxacin (CIPRO) 500 MG tablet Take 1 tablet (500 mg total) by mouth 2 (two) times daily. 12/07/21  Yes Sherrill Raring, PA-C  albuterol (VENTOLIN HFA) 108 (90 Base) MCG/ACT inhaler Inhale 2 puffs into the lungs every 6 (six) hours as needed for wheezing or shortness of breath. 11/17/21   Wendie Agreste, MD  amoxicillin-clavulanate (AUGMENTIN) 875-125 MG tablet Take 1 tablet by mouth 2 (two) times daily. 11/17/21   Wendie Agreste, MD  azelastine (ASTELIN) 0.1 % nasal spray Place 1 spray into both nostrils 2 (two) times daily. Use in each nostril as directed 11/17/21   Wendie Agreste, MD  fexofenadine (ALLEGRA) 180 MG tablet Take 180 mg by mouth daily as needed for allergies or rhinitis.    [provider]  fluticasone (FLONASE) 50 MCG/ACT nasal spray Place 1-2 sprays into both nostrils daily. 11/17/21   Wendie Agreste, MD  fluticasone-salmeterol (ADVAIR) 250-50 MCG/ACT AEPB Inhale 1 puff into the lungs 2 (two) times daily. 11/17/21   Wendie Agreste, MD   furosemide (LASIX) 20 MG tablet Take 1 tablet (20 mg total) by mouth 2 (two) times daily. 09/20/21   Maximiano Coss, NP  ipratropium (ATROVENT) 0.03 % nasal spray Place 2 sprays into both nostrils daily as needed for rhinitis. 02/04/21   Maximiano Coss, NP  lisinopril (ZESTRIL) 10 MG tablet Take 1 tablet (10 mg total) by mouth daily. 11/17/21   Wendie Agreste, MD  montelukast (SINGULAIR) 10 MG tablet Take 1 tablet (10 mg total) by mouth daily. 11/17/21   Wendie Agreste, MD      Allergies    Cefuroxime and Sulfamethoxazole-trimethoprim    Review of Systems   Review of Systems  Respiratory:  Positive for shortness of breath.     Physical Exam Updated Vital Signs BP 104/62   Pulse 88   Temp 99.5 F (37.5 C) (Oral)   Resp 17   Ht 6\' 4"  (1.93 m)   Wt (!) 160.1 kg   SpO2 97%   BMI 42.97 kg/m  Physical Exam Vitals and nursing note reviewed. Exam conducted with a chaperone present.  Constitutional:      Appearance: Normal appearance. He is ill-appearing.  HENT:     Head: Normocephalic and atraumatic.  Eyes:     General: No scleral icterus.       Right eye: No discharge.        Left eye: No discharge.     Extraocular Movements: Extraocular movements intact.     Pupils: Pupils are  equal, round, and reactive to light.  Neck:     Vascular: No JVD.  Cardiovascular:     Rate and Rhythm: Regular rhythm. Tachycardia present.     Pulses: Normal pulses.     Heart sounds: Normal heart sounds. No murmur heard.    No friction rub. No gallop.  Pulmonary:     Effort: Pulmonary effort is normal. No respiratory distress.     Breath sounds: No decreased breath sounds.  Abdominal:     General: Abdomen is flat. Bowel sounds are normal. There is no distension.     Palpations: Abdomen is soft.     Tenderness: There is no abdominal tenderness.  Musculoskeletal:     Right lower leg: No edema.     Left lower leg: No edema.  Skin:    General: Skin is warm and dry.     Coloration: Skin is  not jaundiced.  Neurological:     Mental Status: He is alert. Mental status is at baseline.     Coordination: Coordination normal.     ED Results / Procedures / Treatments   Labs (all labs ordered are listed, but only abnormal results are displayed) Labs Reviewed  CBC WITH DIFFERENTIAL/PLATELET - Abnormal; Notable for the following components:      Result Value   WBC 11.6 (*)    Neutro Abs 9.6 (*)    Abs Immature Granulocytes 0.08 (*)    All other components within normal limits  COMPREHENSIVE METABOLIC PANEL - Abnormal; Notable for the following components:   Sodium 131 (*)    Glucose, Bld 145 (*)    All other components within normal limits  D-DIMER, QUANTITATIVE - Abnormal; Notable for the following components:   D-Dimer, Quant 1.26 (*)    All other components within normal limits  URINALYSIS, ROUTINE W REFLEX MICROSCOPIC - Abnormal; Notable for the following components:   APPearance HAZY (*)    Hgb urine dipstick SMALL (*)    Ketones, ur 15 (*)    Protein, ur 30 (*)    Nitrite POSITIVE (*)    Leukocytes,Ua LARGE (*)    WBC, UA >50 (*)    Bacteria, UA MANY (*)    Non Squamous Epithelial 0-5 (*)    All other components within normal limits  RESP PANEL BY RT-PCR (FLU A&B, COVID) ARPGX2  CULTURE, BLOOD (ROUTINE X 2)  CULTURE, BLOOD (ROUTINE X 2)  URINE CULTURE  BRAIN NATRIURETIC PEPTIDE  LACTIC ACID, PLASMA  LACTIC ACID, PLASMA  TROPONIN I (HIGH SENSITIVITY)  TROPONIN I (HIGH SENSITIVITY)    EKG EKG Interpretation  Date/Time:  Wednesday December 07 2021 12:27:50 EDT Ventricular Rate:  110 PR Interval:  154 QRS Duration: 158 QT Interval:  364 QTC Calculation: 492 R Axis:   112 Text Interpretation: Sinus tachycardia Right 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 Shortness of breath and congestion. EXAM: CT ANGIOGRAPHY CHEST WITH CONTRAST TECHNIQUE: Multidetector CT imaging of the chest was performed using the standard protocol during bolus administration of intravenous contrast. Multiplanar CT image reconstructions and MIPs were obtained to evaluate the vascular anatomy. RADIATION DOSE REDUCTION: This exam was performed according to the departmental dose-optimization program  which includes automated exposure control, adjustment of the mA and/or kV according to patient size and/or use of iterative reconstruction technique. CONTRAST:  110mL OMNIPAQUE IOHEXOL 350 MG/ML SOLN COMPARISON:  Coronary calcium score CT 07/07/2021. Chest CTA 01/27/2021. FINDINGS: Cardiovascular: Pulmonary arterial opacification is suboptimal, and there is also mild respiratory motion artifact. There is no evidence of pulmonary emboli to the level of the proximal segmental arteries. Mild dilatation of the ascending aorta is unchanged, with maximal diameter of 4.1 cm. There is also chronic dilatation of the main pulmonary artery measuring 4.1 cm in diameter. The heart is mildly enlarged, and there is coronary atherosclerosis. There is no pericardial effusion. Mediastinum/Nodes: No enlarged axillary, mediastinal, or hilar lymph nodes. Unremarkable esophagus and thyroid. Lungs/Pleura: No pleural effusion or pneumothorax. No lung consolidation or mass. Upper Abdomen: Hepatic steatosis. Musculoskeletal: No acute osseous abnormality or suspicious osseous lesion. Review of the MIP images confirms the above findings. IMPRESSION: 1. No evidence of central pulmonary emboli on this suboptimal study. 2. 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 3. Unchanged 4.1 cm ascending aortic aneurysm.  Recommend annual imaging followup by CTA or MRA. This recommendation follows 2010 ACCF/AHA/AATS/ACR/ASA/SCA/SCAI/SIR/STS/SVM Guidelines for the Diagnosis and Management of Patients with Thoracic Aortic Disease. Circulation. 2010; 121ML:[id number]. Aortic aneurysm NOS (ICD10-I71.9) 4. Hepatic steatosis. Electronically Signed   By: Logan Bores M.D.   On: 12/07/2021 15:11   DG Chest 2 View  Result Date: 12/07/2021 CLINICAL DATA:  Shortness of breath EXAM: CHEST - 2 VIEW COMPARISON:  02/26/2021 FINDINGS: The heart size and mediastinal contours are within normal limits. Both lungs are clear. The visualized skeletal structures are unremarkable. IMPRESSION: No active cardiopulmonary disease. Electronically Signed   By: Kathreen Devoid M.D.   On: 12/07/2021 12:56    Procedures Procedures    Medications Ordered in ED Medications  acetaminophen (TYLENOL) tablet 650 mg (650 mg Oral Given 12/07/21 1239)  sodium chloride 0.9 % bolus 1,000 mL (0 mLs Intravenous Stopped 12/07/21 1650)  iohexol (OMNIPAQUE) 350 MG/ML injection 100 mL (100 mLs Intravenous Contrast Given 12/07/21 1455)  ciprofloxacin (CIPRO) tablet 500 mg (500 mg Oral Given 12/07/21 1626)    ED Course/ Medical Decision Making/ A&P                           Medical Decision Making Amount and/or Complexity of Data Reviewed Labs: ordered. Radiology: ordered.  Risk OTC drugs. Prescription drug management.   Patient presents due to foul-smelling urine and shortness of breath.  Differential is broad but includes sepsis, UTI, pyelonephritis, PE, pneumonia, ACS, pericarditis, dissection, AAA.  On exam patient is tachycardic, tachypneic and febrile.  Meet SIRS criteria so sepsis rule out was initiated.  His abdomen is soft nontender, his lungs are clear to auscultation and his heart rate is regular although tachycardic.  I ordered, reviewed and interpreted laboratory findings. CBC shows slight leukocytosis of 11.6.  No anemia. CMP without gross  electrolyte derangement, transaminitis or AKI.  Slightly hyperglycemic 145, sodium mildly decreased at 131 COVID and flu negative. BNP is unremarkable. Initial troponin detectable at 16-->16. Negative delta Lactic acid initially 1.4 second pending. Blood cultures pending. Dimer slightly elevated at 1.26.  We will proceed with CTA PE study. UA is notable for nitrite and leukocyte positive consistent with a UTI.  Culture obtained and pending.  I reviewed patient's home medication list, no longer anticoagulated.  I ordered Tylenol 1000 mg  and liter bolus of normal saline.  I ordered, viewed and interpreted imaging studies.  Agree with radiologist interpretation. Chest x-ray without acute process, no cardiomegaly or pneumonia. CTA PE study without any acute process.  Chronic pulmonary artery dilation, stable thoracic aneurysm 4.1 unchanged compared to previous.  I discussed results of the work-up with the patient.  He is aware of the aneurysm and is followed for it, also aware of the chronic dilation will continue following up with his cardiologist.  Given patient has a rash allergy to cephalosporins, will not do Keflex for the UTI.  We will proceed ciprofloxacin while urine culture is processing.  Reevaluated the patient who is feeling significantly improved.  Repeat vitals show resolved of tachycardia, no longer febrile after Tylenol and he is not hypoxic, looks more comfortable.        Final Clinical Impression(s) / ED Diagnoses Final diagnoses:  Acute cystitis with hematuria  SOB (shortness of breath)    Rx / DC Orders ED Discharge Orders          Ordered    ciprofloxacin (CIPRO) 500 MG tablet  2 times daily        12/07/21 1633              Sherrill Raring, PA-C 12/07/21 2214    Varney Biles, MD 12/08/21 1239

## 2021-12-09 LAB — URINE CULTURE: Culture: >=100000 — AB

## 2021-12-10 ENCOUNTER — Telehealth (HOSPITAL_BASED_OUTPATIENT_CLINIC_OR_DEPARTMENT_OTHER): Payer: Self-pay | Admitting: *Deleted

## 2021-12-10 NOTE — Telephone Encounter (Signed)
Post ED Visit - Positive Culture Follow-up  Culture report reviewed by antimicrobial stewardship pharmacist: [place] Team []  Elenor Quinones, Pharm.D. []  Heide Guile, Pharm.D., BCPS AQ-ID []  Parks Neptune, Pharm.D., BCPS []  Alycia Rossetti, Pharm.D., BCPS []  [place], Pharm.D., BCPS, AAHIVP []  Legrand Como, Pharm.D., BCPS, AAHIVP []  Salome Arnt, PharmD, BCPS []  Johnnette Gourd, PharmD, BCPS []  Hughes Better, PharmD, BCPS []  Leeroy Cha, PharmD []  Laqueta Linden, PharmD, BCPS [x]  Lorelei Pont, PharmD  [place] Team []  Leodis Sias, PharmD []  Lindell Spar, PharmD []  Royetta Asal, PharmD []  Graylin Shiver, Rph []  Rema Fendt) Glennon Mac, PharmD []  Arlyn Dunning, PharmD []  Netta Cedars, PharmD []  Dia Sitter, PharmD []  Leone Haven, PharmD []  Gretta Arab, PharmD []  Theodis Shove, PharmD []  Peggyann Juba, PharmD []  Reuel Boom, PharmD   Positive urine culture Treated with Ciprofloxacin, organism sensitive to the same and no further patient follow-up is required at this time.  Rosie Fate 12/10/2021, 1:23 PM

## 2021-12-12 LAB — CULTURE, BLOOD (ROUTINE X 2)
Culture: NO GROWTH
Culture: NO GROWTH
Special Requests: ADEQUATE
Special Requests: ADEQUATE

## 2021-12-19 ENCOUNTER — Other Ambulatory Visit: Payer: Federal, State, Local not specified - PPO

## 2021-12-28 ENCOUNTER — Ambulatory Visit: Payer: Medicare Other | Admitting: Dietician

## 2021-12-30 ENCOUNTER — Telehealth: Payer: Self-pay | Admitting: Primary Care

## 2021-12-30 DIAGNOSIS — R0683 Snoring: Secondary | ICD-10-CM

## 2021-12-30 NOTE — Telephone Encounter (Signed)
PCCs pt was supposed to have HST ordered during [place] in April 2022 with Geraldo Pitter. Order is now placed. Can we expedite this procedure please?

## 2022-01-02 ENCOUNTER — Other Ambulatory Visit: Payer: Federal, State, Local not specified - PPO

## 2022-01-05 NOTE — Telephone Encounter (Signed)
I have left pt a vm to call me back to schedule hst.  Nothing further needed in message.  Will schedule pt when he calls me back.

## 2022-01-12 ENCOUNTER — Encounter: Payer: Self-pay | Admitting: Primary Care

## 2022-01-12 NOTE — Telephone Encounter (Signed)
Left another vm for pt to call me to schedule hst.  Mailed letter for pt to call me.  Nothing further needed.

## 2022-01-17 ENCOUNTER — Other Ambulatory Visit: Payer: Federal, State, Local not specified - PPO

## 2022-02-20 ENCOUNTER — Other Ambulatory Visit: Payer: Self-pay | Admitting: Cardiology

## 2022-02-20 DIAGNOSIS — I2510 Atherosclerotic heart disease of native coronary artery without angina pectoris: Secondary | ICD-10-CM

## 2022-02-21 ENCOUNTER — Telehealth: Payer: Self-pay

## 2022-02-21 NOTE — Telephone Encounter (Signed)
spoke with patient. He said he has too much going on right now, and can't come in on 03/02/2022. He is rescheduling with As[place] and she is also going to schedule the stress as well.

## 2022-02-23 ENCOUNTER — Ambulatory Visit: Payer: Medicare Other | Admitting: Dietician

## 2022-03-02 ENCOUNTER — Ambulatory Visit: Payer: Federal, State, Local not specified - PPO | Admitting: Cardiology

## 2022-03-02 ENCOUNTER — Ambulatory Visit: Payer: Medicare Other | Admitting: Physician Assistant

## 2022-03-07 ENCOUNTER — Encounter: Payer: Self-pay | Admitting: Physician Assistant

## 2022-03-07 ENCOUNTER — Ambulatory Visit (INDEPENDENT_AMBULATORY_CARE_PROVIDER_SITE_OTHER): Payer: Medicare Other | Admitting: Physician Assistant

## 2022-03-07 VITALS — BP 128/75 | HR 83 | Temp 98.2°F | Ht 76.0 in | Wt 363.6 lb

## 2022-03-07 DIAGNOSIS — Z6841 Body Mass Index (BMI) 40.0 and over, adult: Secondary | ICD-10-CM

## 2022-03-07 DIAGNOSIS — F339 Major depressive disorder, recurrent, unspecified: Secondary | ICD-10-CM | POA: Diagnosis not present

## 2022-03-07 DIAGNOSIS — J302 Other seasonal allergic rhinitis: Secondary | ICD-10-CM | POA: Diagnosis not present

## 2022-03-07 DIAGNOSIS — I10 Essential (primary) hypertension: Secondary | ICD-10-CM

## 2022-03-07 DIAGNOSIS — E1165 Type 2 diabetes mellitus with hyperglycemia: Secondary | ICD-10-CM | POA: Diagnosis not present

## 2022-03-07 DIAGNOSIS — J301 Allergic rhinitis due to pollen: Secondary | ICD-10-CM

## 2022-03-07 LAB — POCT GLYCOSYLATED HEMOGLOBIN (HGB A1C): Hemoglobin A1C: 6.4 % — AB (ref 4.0–5.6)

## 2022-03-07 MED ORDER — FLUOXETINE HCL 20 MG PO CAPS: 20.0000 mg | ORAL_CAPSULE | Freq: Every morning | ORAL | 2 refills | Status: DC

## 2022-03-07 MED ORDER — FLUTICASONE PROPIONATE 50 MCG/ACT NA SUSP: 1.0000 | Freq: Every day | NASAL | 11 refills | Status: DC

## 2022-03-07 MED ORDER — AZELASTINE HCL 0.1 % NA SOLN: 1.0000 | Freq: Two times a day (BID) | NASAL | 12 refills | Status: DC

## 2022-03-07 NOTE — Progress Notes (Signed)
Subjective:    Patient ID: Matthew Oconnell, male    DOB: 26-Sep-1955, 67 y.o.   MRN: [id number]  Chief Complaint  Patient presents with   Weight Check   Hypertension    HPI Patient is in today for recheck. States he has been feeling depressed, denies SI/HI. He is still grieving loss of wife and father in the last year. States he is ready to get his health in check this year. Appt later this month with diabetes nutritionist.  Past Medical History:  Diagnosis Date   Asthmatic bronchitis with acute exacerbation    Cellulitis    Coronary artery calcification    Ganglion cyst    Of Left Hand   Gout    Hypertension    Hyperuricemia    Leg swelling    Mild depression    Mild persistent asthma    Morbid obesity (HCC)    Osteoarthritis    Prediabetes    Pulmonary embolism (HCC)    Recurrent sinusitis    Redness    Bilateral lower extremities   Stasis dermatitis of both legs    Venous insufficiency     Past Surgical History:  Procedure Laterality Date   COLONOSCOPY N/A 05/04/2008   ganglion cyst removed from L hand Left    Right hand repair for a compound break Right     Family History  Problem Relation Age of Onset   Other Father        sepsis   Prostate cancer Brother     Social History   Tobacco Use   Smoking status: Former    Packs/day: 1.00    Years: 20.00    Total pack years: 20.00    Types: Cigarettes    Quit date: 2015    Years since quitting: 9.0   Smokeless tobacco: Never  Vaping Use   Vaping Use: Never used  Substance Use Topics   Alcohol use: Yes    Comment: occasionally   Drug use: Not Currently     Allergies  Allergen Reactions   Cefuroxime Rash   Sulfamethoxazole-Trimethoprim Rash    Review of Systems NEGATIVE UNLESS OTHERWISE INDICATED IN HPI      Objective:     BP 128/75   Pulse 83   Temp 98.2 F (36.8 C) (Temporal)   Ht 6\' 4"  (1.93 m)   Wt (!) 363 lb 9.6 oz (164.9 kg)   SpO2 95%   BMI 44.26 kg/m   Wt Readings from  Last 3 Encounters:  03/07/22 (!) 363 lb 9.6 oz (164.9 kg)  12/07/21 (!) 353 lb (160.1 kg)  12/07/21 (!) 353 lb 9.6 oz (160.4 kg)    BP Readings from Last 3 Encounters:  03/07/22 128/75  12/07/21 104/62  12/07/21 130/79     Physical Exam Vitals and nursing note reviewed.  Constitutional:      Appearance: Normal appearance. He is obese.  Eyes:     Conjunctiva/sclera: Conjunctivae normal.     Pupils: Pupils are equal, round, and reactive to light.  Cardiovascular:     Rate and Rhythm: Normal rate and regular rhythm.     Pulses: Normal pulses.     Heart sounds: No murmur heard. Pulmonary:     Effort: Pulmonary effort is normal.     Breath sounds: Normal breath sounds.  Neurological:     General: No focal deficit present.     Mental Status: He is alert.  Psychiatric:        Mood and  Affect: Mood is depressed.        Assessment & Plan:  Type 2 diabetes mellitus with hyperglycemia, without long-term current use of insulin (HCC) Assessment & Plan: Lab Results  Component Value Date   HGBA1C 6.4 (A) 03/07/2022   Stable - will continue lifestyle modifications. Meets with RD later this month. Consider Metformin for additional glucose support.  Orders: -     POCT glycosylated hemoglobin (Hb A1C)  Primary hypertension Assessment & Plan: Stable, normotensive. Continue lisinopril 10 mg daily. Monitor at home.    Depression, recurrent [facility]) Assessment & Plan: [place] Office Visit from 03/07/2022 in [place]  PHQ-9 Total Score 11      Advised counseling - pt declines at this time. Will start prozac 20 mg. Pt aware of risks vs benefits and possible adverse reactions. Close f/up in 8 weeks, sooner if concerns.    Seasonal allergic rhinitis, unspecified trigger -     Azelastine HCl; Place 1 spray into both nostrils 2 (two) times daily. Use in each nostril as directed  Dispense: 30 mL; Refill: 12 -     Fluticasone Propionate; Place 1-2 sprays  into both nostrils daily.  Dispense: 16 g; Refill: 11  BMI 40.0-44.9, adult (HCC)  Non-seasonal allergic rhinitis due to pollen Assessment & Plan: Refilled Flonase and Astelin nasal sprays    Other orders -     FLUoxetine HCl; Take 1 capsule (20 mg total) by mouth every morning.  Dispense: 30 capsule; Refill: 2        Return in about 8 weeks (around 05/02/2022) for recheck.  This note was prepared with assistance of Systems analyst. Occasional wrong-word or sound-a-like substitutions may have occurred due to the inherent limitations of voice recognition software.     Jerrine Urschel M Shakeeta Godette, PA-C

## 2022-03-07 NOTE — Patient Instructions (Signed)
Your A1c is 6.4%; which is still prediabetic range, but definitely can be managed with diet and exercise. Keep up the good work and we'll hold off on medications for diabetes at this time.  Please start on Prozac 20 mg daily to help with depression symptoms. Call if any concerns.  Refilled nasal sprays for you.

## 2022-03-08 NOTE — Assessment & Plan Note (Signed)
Stable, normotensive. Continue lisinopril 10 mg daily. Monitor at home.

## 2022-03-08 NOTE — Assessment & Plan Note (Signed)
[place] Office Visit from 03/07/2022 in [place]  PHQ-9 Total Score 11      Advised counseling - pt declines at this time. Will start prozac 20 mg. Pt aware of risks vs benefits and possible adverse reactions. Close f/up in 8 weeks, sooner if concerns.

## 2022-03-08 NOTE — Assessment & Plan Note (Signed)
Lab Results  Component Value Date   HGBA1C 6.4 (A) 03/07/2022   Stable - will continue lifestyle modifications. Meets with RD later this month. Consider Metformin for additional glucose support.

## 2022-03-08 NOTE — Assessment & Plan Note (Signed)
Refilled Flonase and Astelin nasal sprays

## 2022-03-14 ENCOUNTER — Other Ambulatory Visit: Payer: Federal, State, Local not specified - PPO

## 2022-03-20 ENCOUNTER — Ambulatory Visit (INDEPENDENT_AMBULATORY_CARE_PROVIDER_SITE_OTHER)
Admission: RE | Admit: 2022-03-20 | Discharge: 2022-03-20 | Disposition: A | Discharge status: Home / Self Care | Payer: Medicare Other | Source: Ambulatory Visit | Attending: Physician Assistant | Admitting: Physician Assistant

## 2022-03-20 ENCOUNTER — Encounter: Payer: Self-pay | Admitting: Physician Assistant

## 2022-03-20 ENCOUNTER — Other Ambulatory Visit (INDEPENDENT_AMBULATORY_CARE_PROVIDER_SITE_OTHER): Payer: Medicare Other

## 2022-03-20 ENCOUNTER — Ambulatory Visit (INDEPENDENT_AMBULATORY_CARE_PROVIDER_SITE_OTHER): Payer: Medicare Other | Admitting: Physician Assistant

## 2022-03-20 VITALS — BP 126/74 | HR 111 | Temp 97.7°F | Ht 76.0 in | Wt 357.2 lb

## 2022-03-20 DIAGNOSIS — J302 Other seasonal allergic rhinitis: Secondary | ICD-10-CM

## 2022-03-20 DIAGNOSIS — R0989 Other specified symptoms and signs involving the circulatory and respiratory systems: Secondary | ICD-10-CM

## 2022-03-20 DIAGNOSIS — J4540 Moderate persistent asthma, uncomplicated: Secondary | ICD-10-CM | POA: Diagnosis not present

## 2022-03-20 DIAGNOSIS — Z23 Encounter for immunization: Secondary | ICD-10-CM

## 2022-03-20 DIAGNOSIS — H6692 Otitis media, unspecified, left ear: Secondary | ICD-10-CM | POA: Diagnosis not present

## 2022-03-20 LAB — CBC WITH DIFFERENTIAL/PLATELET
Basophils Absolute: 0 10*3/uL (ref 0.0–0.1)
Basophils Relative: 0.5 % (ref 0.0–3.0)
Eosinophils Absolute: 0 10*3/uL (ref 0.0–0.7)
Eosinophils Relative: 0.3 % (ref 0.0–5.0)
HCT: 49.2 % (ref 39.0–52.0)
Hemoglobin: 17.1 g/dL — ABNORMAL HIGH (ref 13.0–17.0)
Lymphocytes Relative: 12.4 % (ref 12.0–46.0)
Lymphs Abs: 1 10*3/uL (ref 0.7–4.0)
MCHC: 34.9 g/dL (ref 30.0–36.0)
MCV: 93.5 fl (ref 78.0–100.0)
Monocytes Absolute: 1 10*3/uL (ref 0.1–1.0)
Monocytes Relative: 12 % (ref 3.0–12.0)
Neutro Abs: 6.3 10*3/uL (ref 1.4–7.7)
Neutrophils Relative %: 74.8 % (ref 43.0–77.0)
Platelets: 238 10*3/uL (ref 150.0–400.0)
RBC: 5.26 Mil/uL (ref 4.22–5.81)
RDW: 13 % (ref 11.5–15.5)
WBC: 8.4 10*3/uL (ref 4.0–10.5)

## 2022-03-20 LAB — BRAIN NATRIURETIC PEPTIDE: Pro B Natriuretic peptide (BNP): 9 pg/mL (ref 0.0–100.0)

## 2022-03-20 MED ORDER — AMOXICILLIN-POT CLAVULANATE 875-125 MG PO TABS: 1.0000 | ORAL_TABLET | Freq: Two times a day (BID) | ORAL | 0 refills | Status: DC

## 2022-03-20 MED ORDER — FLUTICASONE PROPIONATE 50 MCG/ACT NA SUSP: 1.0000 | Freq: Every day | NASAL | 11 refills | Status: DC

## 2022-03-20 MED ORDER — MONTELUKAST SODIUM 10 MG PO TABS: 10.0000 mg | ORAL_TABLET | Freq: Every day | ORAL | 3 refills | Status: DC

## 2022-03-20 NOTE — Progress Notes (Signed)
Subjective:    Patient ID: Matthew Oconnell, male    DOB: 1955/06/06, 67 y.o.   MRN: [id number]  Chief Complaint  Patient presents with   Nasal Congestion    Pt c/o excessive mucus and congestion; pt is still wheezing and SOB, coughing and states when laying down is worse. Pt already sees ENT due to sinus and narrow passage and drainage;     HPI Patient is in today for acute sick concerns.  A lot of mucous, draining at night, makes him cough and get choked up. Worse in the last few days. One Mucinex dose, otherwise no OTC treatments. States he keeps sinus infections and is planning for sinus surgery. Uses Astelin, needs refill on Flonase.   No fever or chills. Some SOB, but no change from baseline for him. No swelling in the legs. No chest pain. Some wheezing.   Past Medical History:  Diagnosis Date   Asthmatic bronchitis with acute exacerbation    Cellulitis    Coronary artery calcification    Ganglion cyst    Of Left Hand   Gout    Hypertension    Hyperuricemia    Leg swelling    Mild depression    Mild persistent asthma    Morbid obesity (HCC)    Osteoarthritis    Prediabetes    Pulmonary embolism (HCC)    Recurrent sinusitis    Redness    Bilateral lower extremities   Stasis dermatitis of both legs    Venous insufficiency     Past Surgical History:  Procedure Laterality Date   COLONOSCOPY N/A 05/04/2008   ganglion cyst removed from L hand Left    Right hand repair for a compound break Right     Family History  Problem Relation Age of Onset   Other Father        sepsis   Prostate cancer Brother     Social History   Tobacco Use   Smoking status: Former    Packs/day: 1.00    Years: 20.00    Total pack years: 20.00    Types: Cigarettes    Quit date: 2015    Years since quitting: 9.0   Smokeless tobacco: Never  Vaping Use   Vaping Use: Never used  Substance Use Topics   Alcohol use: Yes    Comment: occasionally   Drug use: Not Currently      Allergies  Allergen Reactions   Cefuroxime Rash   Sulfamethoxazole-Trimethoprim Rash    Review of Systems NEGATIVE UNLESS OTHERWISE INDICATED IN HPI      Objective:     BP 126/74 (BP Location: Left Arm)   Pulse (!) 111   Temp 97.7 F (36.5 C) (Temporal)   Ht 6\' 4"  (1.93 m)   Wt (!) 357 lb 3.2 oz (162 kg)   SpO2 97%   BMI 43.48 kg/m   Wt Readings from Last 3 Encounters:  03/20/22 (!) 357 lb 3.2 oz (162 kg)  03/07/22 (!) 363 lb 9.6 oz (164.9 kg)  12/07/21 (!) 353 lb (160.1 kg)    BP Readings from Last 3 Encounters:  03/20/22 126/74  03/07/22 128/75  12/07/21 104/62     Physical Exam Vitals and nursing note reviewed.  Constitutional:      General: He is not in acute distress.    Appearance: Normal appearance. He is not ill-appearing.  HENT:     Head: Normocephalic.     Right Ear: Tympanic membrane, ear canal and external  ear normal.     Left Ear: Ear canal and external ear normal. A middle ear effusion is present. Tympanic membrane is erythematous.     Nose: Congestion present. No rhinorrhea.     Mouth/Throat:     Mouth: Mucous membranes are moist.     Pharynx: No oropharyngeal exudate or posterior oropharyngeal erythema.  Eyes:     Extraocular Movements: Extraocular movements intact.     Conjunctiva/sclera: Conjunctivae normal.     Pupils: Pupils are equal, round, and reactive to light.  Cardiovascular:     Rate and Rhythm: Normal rate and regular rhythm.     Pulses: Normal pulses.     Heart sounds: Normal heart sounds. No murmur heard. Pulmonary:     Effort: Pulmonary effort is normal.     Breath sounds: Wheezing and rhonchi (right upper lung; bases of bilateral lungs) present.  Musculoskeletal:     Cervical back: Normal range of motion.  Skin:    General: Skin is warm.  Neurological:     Mental Status: He is alert and oriented to person, place, and time.  Psychiatric:        Mood and Affect: Mood normal.        Behavior: Behavior normal.         Assessment & Plan:  Chest congestion -     DG Chest 2 View; Future -     Brain natriuretic peptide; Future -     CBC with Differential/Platelet; Future  Acute left otitis media  Need for prophylactic vaccination against Streptococcus pneumoniae (pneumococcus) -     Pneumococcal conjugate vaccine 20-valent  Moderate persistent asthma without complication -     Montelukast Sodium; Take 1 tablet (10 mg total) by mouth daily.  Dispense: 90 tablet; Refill: 3  Seasonal allergic rhinitis, unspecified trigger -     Fluticasone Propionate; Place 1-2 sprays into both nostrils daily.  Dispense: 16 g; Refill: 11  Other orders -     Amoxicillin-Pot Clavulanate; Take 1 tablet by mouth 2 (two) times daily. Take with food.  Dispense: 20 tablet; Refill: 0   Symptoms do seems c/w his typical recurrent sinus infections and asthma exacerbations.  Because of lung exam, I do want to check a chest x-ray today and some basic blood work.  He does have a left otitis media, likely secondary to sinus congestion.  Plan to start him on Augmentin as directed.  He will also start on Flonase and montelukast 10 mg.  He needs to use nasal saline at home.  He also needs to follow-up with ENT and start planning for sinus surgery.  ER precautions advised.     Return if symptoms worsen or fail to improve.  This note was prepared with assistance of Systems analyst. Occasional wrong-word or sound-a-like substitutions may have occurred due to the inherent limitations of voice recognition software.     Lacy Taglieri M Elias Bordner, PA-C

## 2022-03-24 ENCOUNTER — Ambulatory Visit: Payer: Federal, State, Local not specified - PPO | Admitting: Cardiology

## 2022-03-26 NOTE — Progress Notes (Signed)
Patient canceled  Matthew Oconnell [facility]  Pager: [phone] Office: [phone]

## 2022-03-27 ENCOUNTER — Telehealth: Payer: Self-pay | Admitting: Physician Assistant

## 2022-03-27 NOTE — Telephone Encounter (Signed)
Please see pt call msg and advise 

## 2022-03-27 NOTE — Telephone Encounter (Signed)
Patient is scheduled for OV on 03/28/22 with Dr. Randol Kern

## 2022-03-27 NOTE — Telephone Encounter (Signed)
Patient states the Gout in his right foot has come back and requests medication for Gout be sent to:  [place], [place] - [address] Phone: [phone]  Fax: [phone]      FYI, Patient has OV 05/01/22

## 2022-03-28 ENCOUNTER — Ambulatory Visit (INDEPENDENT_AMBULATORY_CARE_PROVIDER_SITE_OTHER): Payer: Medicare Other | Admitting: Internal Medicine

## 2022-03-28 ENCOUNTER — Encounter: Payer: Self-pay | Admitting: Internal Medicine

## 2022-03-28 VITALS — BP 143/78 | HR 106 | Temp 97.2°F | Ht 76.0 in

## 2022-03-28 DIAGNOSIS — M109 Gout, unspecified: Secondary | ICD-10-CM

## 2022-03-28 MED ORDER — COLCHICINE 0.6 MG PO TABS: ORAL_TABLET | ORAL | 0 refills | Status: DC

## 2022-03-28 MED ORDER — HYDROCODONE-ACETAMINOPHEN 10-325 MG PO TABS: 1.0000 | ORAL_TABLET | Freq: Three times a day (TID) | ORAL | 0 refills | Status: AC | PRN

## 2022-03-28 MED ORDER — PREDNISONE 20 MG PO TABS: ORAL_TABLET | ORAL | 0 refills | Status: DC

## 2022-03-28 NOTE — Progress Notes (Signed)
[facility]: [phone]   Routine Medical Office Visit  Patient:  Matthew Oconnell      Age: 67 y.o.       Sex:  male  Date:   03/28/2022  PCP:    Fredirick Lathe, PA-C   Today's Healthcare Provider: Loralee Pacas, MD   Problem Focused Charting:   Medical Decision Making per Assessment/Plan   Niv was seen today for gout in right foot.  Acute gout, unspecified cause, unspecified site -     Colchicine; Day 1: Take 2 tablets, then 1 tablet an hour later. Day 2 and beyond: 1 tab daily.  Dispense: 30 tablet; Refill: 0 -     predniSONE; Take 2 pills for 3 days, 1 pill for 4 days  Dispense: 10 tablet; Refill: 0 -     HYDROcodone-Acetaminophen; Take 1 tablet by mouth every 8 (eight) hours as needed for up to 5 days for severe pain.  Dispense: 15 tablet; Refill: 0   Will see Primary Care Provider (PCP) in 2 days for follow up - put gout info in AVS and advised diet, alcohol avoidance, stay hydrated on NSAIDs and gastrointestinal issues from medications.   Subjective - Clinical Presentation:   Matthew Oconnell is a 67 y.o. male  Patient Active Problem List   Diagnosis Date Noted   Type 2 diabetes mellitus with hyperglycemia, without long-term current use of insulin ([place]) 12/01/2021   Moderate persistent asthma without complication 25/36/6440   Loud snoring 06/21/2021   History of elevated glucose 05/17/2021   Depression, recurrent ([place]) 05/17/2021   Severe sepsis ([place]) 01/28/2021   SOB (shortness of breath) 01/28/2021   Acute pulmonary embolism ([place]) 01/28/2021   Acute asthma exacerbation 01/28/2021   Hyponatremia 01/28/2021   Hypertension    CAP (community acquired pneumonia) 01/27/2021   BMI 40.0-44.9, adult ([place]) 10/11/2020   Allergic rhinitis due to pollen 10/11/2020   COVID-19 virus infection 10/11/2020   Elevated blood-pressure reading without diagnosis of hypertension 10/11/2020   Exposure to confirmed case of COVID-19 10/11/2020   History of  gout 10/11/2020   Gout 10/11/2020   Impaired fasting glucose 10/11/2020   Mild persistent asthma with (acute) exacerbation 10/11/2020   Personal history of colonic polyps 10/11/2020   Prediabetes 10/11/2020   Past Medical History:  Diagnosis Date   Asthmatic bronchitis with acute exacerbation    Cellulitis    Coronary artery calcification    Ganglion cyst    Of Left Hand   Gout    Hypertension    Hyperuricemia    Leg swelling    Mild depression    Mild persistent asthma    Morbid obesity ([place])    Osteoarthritis    Prediabetes    Pulmonary embolism (HCC)    Recurrent sinusitis    Redness    Bilateral lower extremities   Stasis dermatitis of both legs    Venous insufficiency     Outpatient Medications Prior to Visit  Medication Sig   albuterol (VENTOLIN HFA) 108 (90 Base) MCG/ACT inhaler Inhale 2 puffs into the lungs every 6 (six) hours as needed for wheezing or shortness of breath.   amoxicillin-clavulanate (AUGMENTIN) 875-125 MG tablet Take 1 tablet by mouth 2 (two) times daily. Take with food.   azelastine (ASTELIN) 0.1 % nasal spray Place 1 spray into both nostrils 2 (two) times daily. Use in each nostril as directed   fexofenadine (ALLEGRA) 180 MG tablet Take 180 mg by mouth daily as needed  for allergies or rhinitis.   FLUoxetine (PROZAC) 20 MG capsule Take 1 capsule (20 mg total) by mouth every morning.   fluticasone (FLONASE) 50 MCG/ACT nasal spray Place 1-2 sprays into both nostrils daily.   fluticasone-salmeterol (ADVAIR) 250-50 MCG/ACT AEPB Inhale 1 puff into the lungs 2 (two) times daily.   furosemide (LASIX) 20 MG tablet Take 1 tablet (20 mg total) by mouth 2 (two) times daily.   lisinopril (ZESTRIL) 10 MG tablet Take 1 tablet (10 mg total) by mouth daily.   montelukast (SINGULAIR) 10 MG tablet Take 1 tablet (10 mg total) by mouth daily.   No facility-administered medications prior to visit.    Chief Complaint  Patient presents with   Gout in right foot     Severely painful, started on Saturday and was a lot worse by Sunday.    HPI  Patient reports 2-3 days rapidly worsening foot pain He rarely drinks Feels like gout he has had 3x in past 30 year Already knows about diet needs and plans to follow. Has great kidneys and no NSAID contraindication         Objective:  Physical Exam  BP (!) 143/78 (BP Location: Left Arm, Patient Position: Sitting)   Pulse (!) 106   Temp (!) 97.2 F (36.2 C) (Temporal)   Ht 6\' 4"  (1.93 m)   SpO2 93%   BMI 43.48 kg/m  Severely obese  by BMI criteria but truncal adiposity (waist circumference or caliper) should be used instead. Wt Readings from Last 10 Encounters:  03/20/22 (!) 357 lb 3.2 oz (162 kg)  03/07/22 (!) 363 lb 9.6 oz (164.9 kg)  12/07/21 (!) 353 lb (160.1 kg)  12/07/21 (!) 353 lb 9.6 oz (160.4 kg)  12/01/21 (!) 364 lb 6 oz (165.3 kg)  11/17/21 (!) 359 lb (162.8 kg)  07/20/21 (!) 362 lb 9.6 oz (164.5 kg)  06/21/21 (!) 368 lb 3.2 oz (167 kg)  05/17/21 (!) 365 lb 6.4 oz (165.7 kg)  03/31/21 (!) 316 lb 3.2 oz (143.4 kg)   Vital signs reviewed.  Nursing notes reviewed. Weight trend reviewed. General Appearance:  Well developed, well nourished male in no acute distress.   Normal work of breathing at rest Musculoskeletal: All extremities are intact.  Neurological:  Awake, alert,  No obvious focal neurological deficits or cognitive impairments Psychiatric:  Appropriate mood, pleasant demeanor Problem-specific findings:  tender swollen red foot as shown      Results Reviewed: No results found for any visits on 03/28/22.  Recent Results (from the past 2160 hour(s))  POCT HgB A1C     Status: Abnormal   Collection Time: 03/07/22  9:41 AM  Result Value Ref Range   Hemoglobin A1C 6.4 (A) 4.0 - 5.6 %   HbA1c POC (<> result, manual entry)     HbA1c, POC (prediabetic range)     HbA1c, POC (controlled diabetic range)    CBC with Differential/Platelet     Status: Abnormal   Collection Time:  03/20/22 12:28 PM  Result Value Ref Range   WBC 8.4 4.0 - 10.5 K/uL   RBC 5.26 4.22 - 5.81 Mil/uL   Hemoglobin 17.1 (H) 13.0 - 17.0 g/dL   HCT 49.2 39.0 - 52.0 %   MCV 93.5 78.0 - 100.0 fl   MCHC 34.9 30.0 - 36.0 g/dL   RDW 13.0 11.5 - 15.5 %   Platelets 238.0 150.0 - 400.0 K/uL   Neutrophils Relative % 74.8 43.0 - 77.0 %   Lymphocytes Relative  12.4 12.0 - 46.0 %   Monocytes Relative 12.0 3.0 - 12.0 %   Eosinophils Relative 0.3 0.0 - 5.0 %   Basophils Relative 0.5 0.0 - 3.0 %   Neutro Abs 6.3 1.4 - 7.7 K/uL   Lymphs Abs 1.0 0.7 - 4.0 K/uL   Monocytes Absolute 1.0 0.1 - 1.0 K/uL   Eosinophils Absolute 0.0 0.0 - 0.7 K/uL   Basophils Absolute 0.0 0.0 - 0.1 K/uL  Brain natriuretic peptide     Status: None   Collection Time: 03/20/22 12:28 PM  Result Value Ref Range   Pro B Natriuretic peptide (BNP) 9.0 0.0 - 100.0 pg/mL          Signed: Lula Olszewski, MD 03/28/2022 8:30 AM

## 2022-03-30 ENCOUNTER — Ambulatory Visit: Payer: Federal, State, Local not specified - PPO | Admitting: Physician Assistant

## 2022-04-03 ENCOUNTER — Other Ambulatory Visit: Payer: Federal, State, Local not specified - PPO

## 2022-04-03 ENCOUNTER — Telehealth: Payer: Self-pay | Admitting: Physician Assistant

## 2022-04-03 NOTE — Telephone Encounter (Signed)
Copied from [place] [id number]. Topic: Medicare AWV >> Apr 03, 2022 11:15 AM Gillis Santa wrote: Reason for CRM: LVM FOR PATIENT TO CALL [phone] SCHEDULE AWVI WITH HEALTH COACH TINA IN OFFICE/TELE VISIT DUE PER [place] AS OF 02/03/2022

## 2022-04-05 ENCOUNTER — Ambulatory Visit: Payer: Medicare Other | Admitting: Dietician

## 2022-04-13 ENCOUNTER — Ambulatory Visit: Payer: Medicare Other | Admitting: Dietician

## 2022-04-13 ENCOUNTER — Ambulatory Visit: Payer: Federal, State, Local not specified - PPO | Admitting: Cardiology

## 2022-04-18 ENCOUNTER — Ambulatory Visit (INDEPENDENT_AMBULATORY_CARE_PROVIDER_SITE_OTHER): Payer: Medicare Other

## 2022-04-18 VITALS — BP 120/76 | HR 84 | Temp 97.8°F | Wt 347.2 lb

## 2022-04-18 DIAGNOSIS — Z0000 Encounter for general adult medical examination without abnormal findings: Secondary | ICD-10-CM | POA: Diagnosis not present

## 2022-04-18 DIAGNOSIS — Z1211 Encounter for screening for malignant neoplasm of colon: Secondary | ICD-10-CM | POA: Diagnosis not present

## 2022-04-18 DIAGNOSIS — Z86010 Personal history of colonic polyps: Secondary | ICD-10-CM | POA: Diagnosis not present

## 2022-04-18 NOTE — Patient Instructions (Signed)
Matthew Oconnell , Thank you for taking time to come for your Medicare Wellness Visit. I appreciate your ongoing commitment to your health goals. Please review the following plan we discussed and let me know if I can assist you in the future.   These are the goals we discussed:  Goals   None     This is a list of the screening recommended for you and due dates:  Health Maintenance  Topic Date Due   Complete foot exam   Never done   Eye exam for diabetics  Never done   Yearly kidney health urinalysis for diabetes  Never done   Colon Cancer Screening  Never done   Hemoglobin A1C  09/05/2022   Screening for Lung Cancer  12/08/2022   Yearly kidney function blood test for diabetes  12/08/2022   Medicare Annual Wellness Visit  04/19/2023   DTaP/Tdap/Td vaccine (3 - Td or Tdap) 07/15/2025   Pneumonia Vaccine  Completed   Flu Shot  Completed   HPV Vaccine  Aged Out   COVID-19 Vaccine  Discontinued   Hepatitis C Screening: USPSTF Recommendation to screen - Ages 56-79 yo.  Discontinued   Zoster (Shingles) Vaccine  Discontinued    Advanced directives: Advance directive discussed with you today. Even though you declined this today please call our office should you change your mind and we can give you the proper paperwork for you to fill out.  Conditions/risks identified: continue to work out  Next appointment: Follow up in one year for your annual wellness visit.   Preventive Care 57 Years and Older, Male  Preventive care refers to lifestyle choices and visits with your health care provider that can promote health and wellness. What does preventive care include? A yearly physical exam. This is also called an annual well check. Dental exams once or twice a year. Routine eye exams. Ask your health care provider how often you should have your eyes checked. Personal lifestyle choices, including: Daily care of your teeth and gums. Regular physical activity. Eating a healthy diet. Avoiding  tobacco and drug use. Limiting alcohol use. Practicing safe sex. Taking low doses of aspirin every day. Taking vitamin and mineral supplements as recommended by your health care provider. What happens during an annual well check? The services and screenings done by your health care provider during your annual well check will depend on your age, overall health, lifestyle risk factors, and family history of disease. Counseling  Your health care provider may ask you questions about your: Alcohol use. Tobacco use. Drug use. Emotional well-being. Home and relationship well-being. Sexual activity. Eating habits. History of falls. Memory and ability to understand (cognition). Work and work Statistician. Screening  You may have the following tests or measurements: Height, weight, and BMI. Blood pressure. Lipid and cholesterol levels. These may be checked every 5 years, or more frequently if you are over 32 years old. Skin check. Lung cancer screening. You may have this screening every year starting at age 65 if you have a 30-pack-year history of smoking and currently smoke or have quit within the past 15 years. Fecal occult blood test (FOBT) of the stool. You may have this test every year starting at age 72. Flexible sigmoidoscopy or colonoscopy. You may have a sigmoidoscopy every 5 years or a colonoscopy every 10 years starting at age 54. Prostate cancer screening. Recommendations will vary depending on your family history and other risks. Hepatitis C blood test. Hepatitis B blood test. Sexually transmitted disease (  STD) testing. Diabetes screening. This is done by checking your blood sugar (glucose) after you have not eaten for a while (fasting). You may have this done every 1-3 years. Abdominal aortic aneurysm (AAA) screening. You may need this if you are a current or former smoker. Osteoporosis. You may be screened starting at age 50 if you are at high risk. Talk with your health care  provider about your test results, treatment options, and if necessary, the need for more tests. Vaccines  Your health care provider may recommend certain vaccines, such as: Influenza vaccine. This is recommended every year. Tetanus, diphtheria, and acellular pertussis (Tdap, Td) vaccine. You may need a Td booster every 10 years. Zoster vaccine. You may need this after age 50. Pneumococcal 13-valent conjugate (PCV13) vaccine. One dose is recommended after age 43. Pneumococcal polysaccharide (PPSV23) vaccine. One dose is recommended after age 13. Talk to your health care provider about which screenings and vaccines you need and how often you need them. This information is not intended to replace advice given to you by your health care provider. Make sure you discuss any questions you have with your health care provider. Document Released: 03/19/2015 Document Revised: 11/10/2015 Document Reviewed: 12/22/2014 Elsevier Interactive Patient Education  2017 [place] Prevention in the Home Falls can cause injuries. They can happen to people of all ages. There are many things you can do to make your home safe and to help prevent falls. What can I do on the outside of my home? Regularly fix the edges of walkways and driveways and fix any cracks. Remove anything that might make you trip as you walk through a door, such as a raised step or threshold. Trim any bushes or trees on the path to your home. Use bright outdoor lighting. Clear any walking paths of anything that might make someone trip, such as rocks or tools. Regularly check to see if handrails are loose or broken. Make sure that both sides of any steps have handrails. Any raised decks and porches should have guardrails on the edges. Have any leaves, snow, or ice cleared regularly. Use sand or salt on walking paths during winter. Clean up any spills in your garage right away. This includes oil or grease spills. What can I do in the  bathroom? Use night lights. Install grab bars by the toilet and in the tub and shower. Do not use towel bars as grab bars. Use non-skid mats or decals in the tub or shower. If you need to sit down in the shower, use a plastic, non-slip stool. Keep the floor dry. Clean up any water that spills on the floor as soon as it happens. Remove soap buildup in the tub or shower regularly. Attach bath mats securely with double-sided non-slip rug tape. Do not have throw rugs and other things on the floor that can make you trip. What can I do in the bedroom? Use night lights. Make sure that you have a light by your bed that is easy to reach. Do not use any sheets or blankets that are too big for your bed. They should not hang down onto the floor. Have a firm chair that has side arms. You can use this for support while you get dressed. Do not have throw rugs and other things on the floor that can make you trip. What can I do in the kitchen? Clean up any spills right away. Avoid walking on wet floors. Keep items that you use a lot  in easy-to-reach places. If you need to reach something above you, use a strong step stool that has a grab bar. Keep electrical cords out of the way. Do not use floor polish or wax that makes floors slippery. If you must use wax, use non-skid floor wax. Do not have throw rugs and other things on the floor that can make you trip. What can I do with my stairs? Do not leave any items on the stairs. Make sure that there are handrails on both sides of the stairs and use them. Fix handrails that are broken or loose. Make sure that handrails are as long as the stairways. Check any carpeting to make sure that it is firmly attached to the stairs. Fix any carpet that is loose or worn. Avoid having throw rugs at the top or bottom of the stairs. If you do have throw rugs, attach them to the floor with carpet tape. Make sure that you have a light switch at the top of the stairs and the  bottom of the stairs. If you do not have them, ask someone to add them for you. What else can I do to help prevent falls? Wear shoes that: Do not have high heels. Have rubber bottoms. Are comfortable and fit you well. Are closed at the toe. Do not wear sandals. If you use a stepladder: Make sure that it is fully opened. Do not climb a closed stepladder. Make sure that both sides of the stepladder are locked into place. Ask someone to hold it for you, if possible. Clearly mark and make sure that you can see: Any grab bars or handrails. First and last steps. Where the edge of each step is. Use tools that help you move around (mobility aids) if they are needed. These include: Canes. Walkers. Scooters. Crutches. Turn on the lights when you go into a dark area. Replace any light bulbs as soon as they burn out. Set up your furniture so you have a clear path. Avoid moving your furniture around. If any of your floors are uneven, fix them. If there are any pets around you, be aware of where they are. Review your medicines with your doctor. Some medicines can make you feel dizzy. This can increase your chance of falling. Ask your doctor what other things that you can do to help prevent falls. This information is not intended to replace advice given to you by your health care provider. Make sure you discuss any questions you have with your health care provider. Document Released: 12/17/2008 Document Revised: 07/29/2015 Document Reviewed: 03/27/2014 Elsevier Interactive Patient Education  2017 [employer].

## 2022-04-18 NOTE — Progress Notes (Signed)
Subjective:   Matthew Oconnell is a 67 y.o. male who presents for an Initial Medicare Annual Wellness Visit.  Patient Medicare AWV questionnaire was completed by the patient on 04/14/22; I have confirmed that all information answered by patient is correct and no changes since this date.        Review of Systems    Cardiac Risk Factors include: advanced age (>51mn, >>62women);obesity (BMI >30kg/m2);male gender;hypertension;diabetes mellitus     Objective:    Today's Vitals   04/18/22 1459  BP: 120/76  Pulse: 84  Temp: 97.8 F (36.6 C)  SpO2: 94%  Weight: (!) 347 lb 3.2 oz (157.5 kg)   Body mass index is 42.26 kg/m.     04/18/2022    3:11 PM 12/07/2021   12:27 PM 01/28/2021   10:12 PM 01/27/2021    6:13 PM  Advanced Directives  Does Patient Have a Medical Advance Directive? No No No   Would patient like information on creating a medical advance directive? No - Patient declined   No - Patient declined    Current Medications (verified) Outpatient Encounter Medications as of 04/18/2022  Medication Sig   albuterol (VENTOLIN HFA) 108 (90 Base) MCG/ACT inhaler Inhale 2 puffs into the lungs every 6 (six) hours as needed for wheezing or shortness of breath.   azelastine (ASTELIN) 0.1 % nasal spray Place 1 spray into both nostrils 2 (two) times daily. Use in each nostril as directed   colchicine 0.6 MG tablet Day 1: Take 2 tablets, then 1 tablet an hour later. Day 2 and beyond: 1 tab daily.   fexofenadine (ALLEGRA) 180 MG tablet Take 180 mg by mouth daily as needed for allergies or rhinitis.   FLUoxetine (PROZAC) 20 MG capsule Take 1 capsule (20 mg total) by mouth every morning.   fluticasone (FLONASE) 50 MCG/ACT nasal spray Place 1-2 sprays into both nostrils daily.   fluticasone-salmeterol (ADVAIR) 250-50 MCG/ACT AEPB Inhale 1 puff into the lungs 2 (two) times daily.   furosemide (LASIX) 20 MG tablet Take 1 tablet (20 mg total) by mouth 2 (two) times daily.   lisinopril  (ZESTRIL) 10 MG tablet Take 1 tablet (10 mg total) by mouth daily.   montelukast (SINGULAIR) 10 MG tablet Take 1 tablet (10 mg total) by mouth daily.   predniSONE (DELTASONE) 20 MG tablet Take 2 pills for 3 days, 1 pill for 4 days   [DISCONTINUED] amoxicillin-clavulanate (AUGMENTIN) 875-125 MG tablet Take 1 tablet by mouth 2 (two) times daily. Take with food.   No facility-administered encounter medications on file as of 04/18/2022.    Allergies (verified) Cefuroxime and Sulfamethoxazole-trimethoprim   History: Past Medical History:  Diagnosis Date   Asthmatic bronchitis with acute exacerbation    Cellulitis    Coronary artery calcification    Ganglion cyst    Of Left Hand   Gout    Hypertension    Hyperuricemia    Leg swelling    Mild depression    Mild persistent asthma    Morbid obesity (HCC)    Osteoarthritis    Prediabetes    Pulmonary embolism (HCC)    Recurrent sinusitis    Redness    Bilateral lower extremities   Stasis dermatitis of both legs    Venous insufficiency    Past Surgical History:  Procedure Laterality Date   COLONOSCOPY N/A 05/04/2008   ganglion cyst removed from L hand Left    Right hand repair for a compound break Right  Family History  Problem Relation Age of Onset   Other Father        sepsis   Prostate cancer Brother    Social History   Socioeconomic History   Marital status: Widowed    Spouse name: Not on file   Number of children: 4   Years of education: Not on file   Highest education level: Not on file  Occupational History   Not on file  Tobacco Use   Smoking status: Former    Packs/day: 1.00    Years: 20.00    Total pack years: 20.00    Types: Cigarettes    Quit date: 2015    Years since quitting: 9.1   Smokeless tobacco: Never  Vaping Use   Vaping Use: Never used  Substance and Sexual Activity   Alcohol use: Yes    Comment: occasionally   Drug use: Not Currently   Sexual activity: Not on file  Other Topics  Concern   Not on file  Social History Narrative   Not on file   Social Determinants of Health   Financial Resource Strain: Low Risk  (04/14/2022)   Overall Financial Resource Strain (CARDIA)    Difficulty of Paying Living Expenses: Not hard at all  Food Insecurity: No Food Insecurity (04/14/2022)   Hunger Vital Sign    Worried About Running Out of Food in the Last Year: Never true    Ran Out of Food in the Last Year: Never true  Transportation Needs: No Transportation Needs (04/14/2022)   PRAPARE - Hydrologist (Medical): No    Lack of Transportation (Non-Medical): No  Physical Activity: Sufficiently Active (04/14/2022)   Exercise Vital Sign    Days of Exercise per Week: 5 days    Minutes of Exercise per Session: 60 min  Stress: No Stress Concern Present (04/14/2022)   [place]    Feeling of Stress : Not at all  Social Connections: Socially Isolated (04/14/2022)   Social Connection and Isolation Panel [NHANES]    Frequency of Communication with Friends and Family: Twice a week    Frequency of Social Gatherings with Friends and Family: Twice a week    Attends Religious Services: Never    Marine scientist or Organizations: No    Attends Archivist Meetings: Never    Marital Status: Widowed    Tobacco Counseling Counseling given: Not Answered   Clinical Intake:  Pre-visit preparation completed: Yes  Pain : No/denies pain     BMI - recorded: 42.26 Nutritional Risks: None Diabetes: Yes CBG done?: No Did pt. bring in CBG monitor from home?: No  How often do you need to have someone help you when you read instructions, pamphlets, or other written materials from your doctor or pharmacy?: 1 - Never  Diabetic?Nutrition Risk Assessment:  Has the patient had any N/V/D within the last 2 months?  No  Does the patient have any non-healing wounds?  No  Has the patient had any  unintentional weight loss or weight gain?  No   Diabetes:  Is the patient diabetic?  Yes  If diabetic, was a CBG obtained today?  No  Did the patient bring in their glucometer from home?  No  How often do you monitor your CBG's? N/a .   Financial Strains and Diabetes Management:  Are you having any financial strains with the device, your supplies or your medication? No .  Does the patient want to be seen by Chronic Care Management for management of their diabetes?  No  Would the patient like to be referred to a Nutritionist or for Diabetic Management?  No   Diabetic Exams:  Diabetic Eye Exam: Overdue for diabetic eye exam. Pt has been advised about the importance in completing this exam. Patient advised to call and schedule an eye exam. Diabetic Foot Exam: Overdue, Pt has been advised about the importance in completing this exam. Pt is scheduled for diabetic foot exam on next .   Interpreter Needed?: No  Information entered by :: Charlott Rakes, LPN   Activities of Daily Living    04/14/2022    8:55 AM  In your present state of health, do you have any difficulty performing the following activities:  Hearing? 0  Vision? 0  Difficulty concentrating or making decisions? 0  Walking or climbing stairs? 1  Dressing or bathing? 0  Doing errands, shopping? 0  Preparing Food and eating ? N  Using the Toilet? N  In the past six months, have you accidently leaked urine? N  Do you have problems with loss of bowel control? N  Managing your Medications? N  Managing your Finances? N  Housekeeping or managing your Housekeeping? N    Patient Care Team: [place], Randa Evens, PA-C as PCP - General (Physician Assistant) [facility])  Indicate any recent Medical Services you may have received from other than [facility] providers in the past year (date may be approximate).     Assessment:   This is a routine wellness examination for  [facility].  Hearing/Vision screen Hearing Screening - Comments:: Pt denies any hearing issues  Vision Screening - Comments:: Pt follows up with [facility]   Dietary issues and exercise activities discussed: Current Exercise Habits: Home exercise routine, Type of exercise: walking, Time (Minutes): 60, Frequency (Times/Week): 5, Weekly Exercise (Minutes/Week): 300   Goals Addressed             This Visit's Progress    Patient Stated       Continue to work out        Depression Screen    04/18/2022    3:06 PM 03/07/2022    9:33 AM 11/17/2021   10:21 AM 07/20/2021    1:19 PM 05/17/2021    7:55 AM 02/04/2021    9:39 AM  PHQ 2/9 Scores  PHQ - 2 Score 0 2 2 3 4 1  $ PHQ- 9 Score 0 11 6 6 9 4    $ Fall Risk    04/18/2022    3:11 PM 04/14/2022    8:55 AM 03/07/2022    9:00 AM 11/17/2021   10:23 AM 07/20/2021    1:19 PM  Fall Risk   Falls in the past year? 0 0 0 0 0  Number falls in past yr: 0 0 0 0 0  Injury with Fall? 0 0 0 0 0  Risk for fall due to : Impaired vision  No Fall Risks No Fall Risks No Fall Risks  Follow up Falls prevention discussed   Falls evaluation completed Falls evaluation completed    [place]:  Any stairs in or around the home? Yes  If so, are there any without handrails? No  Home free of loose throw rugs in walkways, pet beds, electrical cords, etc? Yes  Adequate lighting in your home to reduce risk of falls? Yes  ASSISTIVE DEVICES UTILIZED TO PREVENT FALLS:  Life alert? No  Use of a cane, walker or w/c? No  Grab bars in the bathroom? No  Shower chair or bench in shower? No  Elevated toilet seat or a handicapped toilet? No   TIMED UP AND GO:  Was the test performed? Yes .  Length of time to ambulate 10 feet: 15 sec.   Gait steady and fast without use of assistive device  Cognitive Function:        04/18/2022    3:14 PM  6CIT Screen  What Year? 0 points  What month? 0 points  What time? 0 points   Count back from 20 0 points  Months in reverse 0 points  Repeat phrase 0 points  Total Score 0 points    Immunizations Immunization History  Administered Date(s) Administered   Fluad Quad(high Dose 65+) 11/17/2021   Influenza Split 11/28/2015   PFIZER(Purple Top)SARS-COV-2 Vaccination 06/17/2019   PNEUMOCOCCAL CONJUGATE-20 03/20/2022   Pneumococcal Polysaccharide-23 01/31/2021   Tdap 07/20/2005, 07/16/2015    TDAP status: Up to date  Flu Vaccine status: Up to date  Pneumococcal vaccine status: Up to date  Covid-19 vaccine status: Declined, Education has been provided regarding the importance of this vaccine but patient still declined. Advised may receive this vaccine at local pharmacy or Health Dept.or vaccine clinic. Aware to provide a copy of the vaccination record if obtained from local pharmacy or Health Dept. Verbalized acceptance and understanding.  Qualifies for Shingles Vaccine? Yes   Zostavax completed No   Shingrix Completed?: No.    Education has been provided regarding the importance of this vaccine. Patient has been advised to call insurance company to determine out of pocket expense if they have not yet received this vaccine. Advised may also receive vaccine at local pharmacy or Health Dept. Verbalized acceptance and understanding.  Screening Tests Health Maintenance  Topic Date Due   FOOT EXAM  Never done   OPHTHALMOLOGY EXAM  Never done   Diabetic kidney evaluation - Urine ACR  Never done   COLONOSCOPY (Pts 45-80yr Insurance coverage will need to be confirmed)  Never done   HEMOGLOBIN A1C  09/05/2022   Lung Cancer Screening  12/08/2022   Diabetic kidney evaluation - eGFR measurement  12/08/2022   Medicare Annual Wellness (AWV)  04/19/2023   DTaP/Tdap/Td (3 - Td or Tdap) 07/15/2025   Pneumonia Vaccine 67 Years old  Completed   INFLUENZA VACCINE  Completed   HPV VACCINES  Aged Out   COVID-19 Vaccine  Discontinued   Hepatitis C Screening  Discontinued    Zoster Vaccines- Shingrix  Discontinued    Health Maintenance  Health Maintenance Due  Topic Date Due   FOOT EXAM  Never done   OPHTHALMOLOGY EXAM  Never done   Diabetic kidney evaluation - Urine ACR  Never done   COLONOSCOPY (Pts 45-496yrInsurance coverage will need to be confirmed)  Never done    Colorectal cancer screening: Referral to GI placed 04/18/22. Pt aware the office will call re: appt.   Additional Screening:  Hepatitis C Screening: does not qualify;  Vision Screening: Recommended annual ophthalmology exams for early detection of glaucoma and other disorders of the eye. Is the patient up to date with their annual eye exam?  No  Who is the provider or what is the name of the office in which the patient attends annual eye exams? [facility]  If pt is not established with a provider, would they like to  be referred to a provider to establish care? No .   Dental Screening: Recommended annual dental exams for proper oral hygiene  Community Resource Referral / Chronic Care Management: CRR required this visit?  No   CCM required this visit?  No      Plan:     I have personally reviewed and noted the following in the patient's chart:   Medical and social history Use of alcohol, tobacco or illicit drugs  Current medications and supplements including opioid prescriptions. Patient is not currently taking opioid prescriptions. Functional ability and status Nutritional status Physical activity Advanced directives List of other physicians Hospitalizations, surgeries, and ER visits in previous 12 months Vitals Screenings to include cognitive, depression, and falls Referrals and appointments  In addition, I have reviewed and discussed with patient certain preventive protocols, quality metrics, and best practice recommendations. A written personalized care plan for preventive services as well as general preventive health recommendations were provided to patient.     Willette Brace, LPN   [id number]   Nurse Notes: none

## 2022-05-01 ENCOUNTER — Encounter: Payer: Self-pay | Admitting: Dietician

## 2022-05-01 ENCOUNTER — Encounter: Payer: Self-pay | Admitting: Physician Assistant

## 2022-05-01 ENCOUNTER — Ambulatory Visit (INDEPENDENT_AMBULATORY_CARE_PROVIDER_SITE_OTHER): Payer: Medicare Other | Admitting: Physician Assistant

## 2022-05-01 ENCOUNTER — Encounter: Payer: Medicare Other | Attending: Physician Assistant | Admitting: Dietician

## 2022-05-01 VITALS — BP 128/76 | HR 100 | Temp 97.3°F | Ht 73.0 in

## 2022-05-01 VITALS — Ht 73.0 in

## 2022-05-01 DIAGNOSIS — M109 Gout, unspecified: Secondary | ICD-10-CM

## 2022-05-01 DIAGNOSIS — M10071 Idiopathic gout, right ankle and foot: Secondary | ICD-10-CM

## 2022-05-01 DIAGNOSIS — E119 Type 2 diabetes mellitus without complications: Secondary | ICD-10-CM | POA: Insufficient documentation

## 2022-05-01 MED ORDER — COLCHICINE 0.6 MG PO TABS: ORAL_TABLET | ORAL | 0 refills | Status: DC

## 2022-05-01 NOTE — Progress Notes (Unsigned)
Diabetes Self-Management Education  Visit Type: First/Initial  Appt. Start Time: 0800 Appt. End Time: 0830  05/03/2022  [name], identified by name and date of birth, is a 67 y.o. male with a diagnosis of Diabetes: Type 2.   ASSESSMENT  History includes: depression, arthritis, asthma, HN, type 2 diabetes Labs noted: 03/07/22 A1c 6.4%, down from 6.6% 11/17/21 Medications include: reviewed Supplements: none reported  This is a phone visit. Provider Location: office Patient Location: home  Pt unable to come in person due to gout flare-up.   Pt states he has been eating more vegetables and less sweets since his diabetes diagnosis.   Pt brought his A1c from 6.6 to 6.4%, most recently.   Pt reports he tries to eat protein and veggies with his lunch and dinner.   Pt reports he exercises for 1 hour every day, both weight training and walking.   Height '6\' 1"'$  (1.854 m). Body mass index is 45.81 kg/m.   Diabetes Self-Management Education - 05/01/22 0805       Visit Information   Visit Type First/Initial      Initial Visit   Diabetes Type Type 2      Health Coping   How would you rate your overall health? Good      Psychosocial Assessment   Patient Belief/Attitude about Diabetes Motivated to manage diabetes    What is the hardest part about your diabetes right now, causing you the most concern, or is the most worrisome to you about your diabetes?   Making healty food and beverage choices    Self-care barriers None    Self-management support Doctor's office    Other persons present Patient    Patient Concerns Nutrition/Meal planning    Special Needs None    Preferred Learning Style No preference indicated    Learning Readiness Ready    How often do you need to have someone help you when you read instructions, pamphlets, or other written materials from your doctor or pharmacy? 1 - Never    What is the last grade level you completed in school? HS       Pre-Education Assessment   Patient understands the diabetes disease and treatment process. Needs Instruction    Patient understands incorporating nutritional management into lifestyle. Needs Instruction    Patient undertands incorporating physical activity into lifestyle. Needs Instruction    Patient understands using medications safely. Needs Instruction    Patient understands monitoring blood glucose, interpreting and using results Needs Instruction    Patient understands prevention, detection, and treatment of acute complications. Needs Instruction    Patient understands prevention, detection, and treatment of chronic complications. Needs Instruction    Patient understands how to develop strategies to address psychosocial issues. Needs Instruction    Patient understands how to develop strategies to promote health/change behavior. Needs Instruction      Complications   Last HgB A1C per patient/outside source 6.4 %    How often do you check your blood sugar? 0 times/day (not testing)    Have you had a dilated eye exam in the past 12 months? Yes    Have you had a dental exam in the past 12 months? Yes    Are you checking your feet? Yes      Dietary Intake   Breakfast boiled eggs and special K with a banana    Snack (morning) none    Lunch salad sometimes with chicken and veggies    Snack (afternoon) none  Dinner protein and veggies sometimes starch    Snack (evening) none    Beverage(s) water 64-72 oz, and black coffee      Activity / Exercise   Activity / Exercise Type Light (walking / raking leaves)    How many days per week do you exercise? 6    How many minutes per day do you exercise? 60    Total minutes per week of exercise 360      Patient Education   Previous Diabetes Education No    Disease Pathophysiology Factors that contribute to the development of diabetes;Definition of diabetes, type 1 and 2, and the diagnosis of diabetes;Explored patient's options for treatment of  their diabetes    Healthy Eating Role of diet in the treatment of diabetes and the relationship between the three main macronutrients and blood glucose level;Reviewed blood glucose goals for pre and post meals and how to evaluate the patients' food intake on their blood glucose level.;Meal timing in regards to the patients' current diabetes medication.;Meal options for control of blood glucose level and chronic complications.;Plate Method    Being Active Role of exercise on diabetes management, blood pressure control and cardiac health.;Helped patient identify appropriate exercises in relation to his/her diabetes, diabetes complications and other health issue.    Monitoring Identified appropriate SMBG and/or A1C goals.;Yearly dilated eye exam;Daily foot exams    Acute complications Discussed and identified patients' prevention, symptoms, and treatment of hyperglycemia.;Taught prevention, symptoms, and  treatment of hypoglycemia - the 15 rule.    Chronic complications Relationship between chronic complications and blood glucose control;Assessed and discussed foot care and prevention of foot problems;Lipid levels, blood glucose control and heart disease;Dental care;Identified and discussed with patient  current chronic complications;Retinopathy and reason for yearly dilated eye exams;Nephropathy, what it is, prevention of, the use of ACE, ARB's and early detection of through urine microalbumia.;Reviewed with patient heart disease, higher risk of, and prevention    Diabetes Stress and Support Identified and addressed patients feelings and concerns about diabetes;Worked with patient to identify barriers to care and solutions;Role of stress on diabetes    Lifestyle and Health Coping Lifestyle issues that need to be addressed for better diabetes care      Individualized Goals (developed by patient)   Nutrition General guidelines for healthy choices and portions discussed    Physical Activity Exercise 5-7 days  per week    Medications take my medication as prescribed    Monitoring  Test my blood glucose as discussed    Problem Solving Eating Pattern    Reducing Risk examine blood glucose patterns;do foot checks daily;treat hypoglycemia with 15 grams of carbs if blood glucose less than '70mg'$ /dL    Health Coping Ask for help with psychological, social, or emotional issues      Post-Education Assessment   Patient understands the diabetes disease and treatment process. Comprehends key points    Patient understands incorporating nutritional management into lifestyle. Comprehends key points    Patient undertands incorporating physical activity into lifestyle. Comprehends key points    Patient understands using medications safely. Comphrehends key points    Patient understands monitoring blood glucose, interpreting and using results Comprehends key points    Patient understands prevention, detection, and treatment of acute complications. Comprehends key points    Patient understands prevention, detection, and treatment of chronic complications. Comprehends key points    Patient understands how to develop strategies to address psychosocial issues. Comprehends key points    Patient understands how to develop  strategies to promote health/change behavior. Comprehends key points      Outcomes   Expected Outcomes Demonstrated interest in learning. Expect positive outcomes    Future DMSE 3-4 months    Program Status Not Completed             Individualized Plan for Diabetes Self-Management Training:   Learning Objective:  Patient will have a greater understanding of diabetes self-management. Patient education plan is to attend individual and/or group sessions per assessed needs and concerns.   Plan:   There are no Patient Instructions on file for this visit.  Expected Outcomes:  Demonstrated interest in learning. Expect positive outcomes  Education material provided: Pt will be provided with  education materials upon in-person follow up.   If problems or questions, patient to contact team via:  Phone  Future DSME appointment: 3-4 months

## 2022-05-01 NOTE — Progress Notes (Signed)
Subjective:    Patient ID: Matthew Oconnell, male    DOB: November 11, 1955, 67 y.o.   MRN: [id number]  Chief Complaint  Patient presents with   Follow-up    Pt in office for 8 wk follow up; pt has gout flare up not able to walk well; extreme redness and swelling in right ankle; no other concerns to discuss per patient    HPI Patient is in today for recurrent R ankle gout. Started back up 2 days ago. Previously seen on 03/28/22 for flare up with Dr. Randol Kern, took treatments as prescribed and symptoms improved right away / resolved. Says he's been eating more meat. Not on allopurinol, didn't think he ever really needed it.    Past Medical History:  Diagnosis Date   Asthmatic bronchitis with acute exacerbation    Cellulitis    Coronary artery calcification    Ganglion cyst    Of Left Hand   Gout    Hypertension    Hyperuricemia    Leg swelling    Mild depression    Mild persistent asthma    Morbid obesity ([place])    Osteoarthritis    Prediabetes    Pulmonary embolism (H[facility])    Recurrent sinusitis    Redness    Bilateral lower extremities   Stasis dermatitis of both legs    Venous insufficiency     Past Surgical History:  Procedure Laterality Date   COLONOSCOPY N/A 05/04/2008   ganglion cyst removed from L hand Left    Right hand repair for a compound break Right     Family History  Problem Relation Age of Onset   Other Father        sepsis   Prostate cancer Brother     Social History   Tobacco Use   Smoking status: Former    Packs/day: 1.00    Years: 20.00    Total pack years: 20.00    Types: Cigarettes    Quit date: 2015    Years since quitting: 9.1   Smokeless tobacco: Never  Vaping Use   Vaping Use: Never used  Substance Use Topics   Alcohol use: Yes    Comment: occasionally   Drug use: Not Currently     Allergies  Allergen Reactions   Cefuroxime Rash   Sulfamethoxazole-Trimethoprim Rash    Review of Systems NEGATIVE UNLESS OTHERWISE INDICATED IN  HPI      Objective:     BP 128/76 (BP Location: Left Arm)   Pulse 100   Temp (!) 97.3 F (36.3 C) (Temporal)   Ht '6\' 1"'$  (1.854 m)   SpO2 95%   BMI 45.81 kg/m   Wt Readings from Last 3 Encounters:  04/18/22 (!) 347 lb 3.2 oz (157.5 kg)  03/20/22 (!) 357 lb 3.2 oz (162 kg)  03/07/22 (!) 363 lb 9.6 oz (164.9 kg)    BP Readings from Last 3 Encounters:  05/01/22 128/76  04/18/22 120/76  03/28/22 (!) 143/78     Physical Exam Vitals and nursing note reviewed.  Constitutional:      Appearance: Normal appearance. He is obese.  Cardiovascular:     Rate and Rhythm: Normal rate and regular rhythm.     Pulses: Normal pulses.  Pulmonary:     Effort: Pulmonary effort is normal.     Breath sounds: Normal breath sounds.  Musculoskeletal:       Feet:  Neurological:     Mental Status: He is alert.  Psychiatric:  Mood and Affect: Mood normal.        Behavior: Behavior normal.        Assessment & Plan:  Acute idiopathic gout of right ankle  Acute gout, unspecified cause, unspecified site -     Colchicine; Day 1: Take 2 tablets, then 1 tablet an hour later. Day 2 and beyond: 1 tab daily.  Dispense: 30 tablet; Refill: 0  Recurrent gout - recent episode last month. Did well with colchicine. Start on med as directed. Needs to work on dietary changes. Discussed allopurinol again with him today once this flare up resolves. Pt to keep me updated.       Return in about 3 months (around 07/30/2022) for med check / labs .     Elbia Paro M Angelo Prindle, PA-C

## 2022-05-24 ENCOUNTER — Ambulatory Visit (INDEPENDENT_AMBULATORY_CARE_PROVIDER_SITE_OTHER): Payer: Medicare Other | Admitting: Physician Assistant

## 2022-05-24 ENCOUNTER — Encounter: Payer: Self-pay | Admitting: Physician Assistant

## 2022-05-24 VITALS — BP 116/80 | HR 108 | Temp 97.1°F | Ht 73.0 in | Wt 350.8 lb

## 2022-05-24 DIAGNOSIS — N3001 Acute cystitis with hematuria: Secondary | ICD-10-CM

## 2022-05-24 LAB — POCT URINALYSIS DIPSTICK
Bilirubin, UA: NEGATIVE
Blood, UA: POSITIVE
Glucose, UA: NEGATIVE
Ketones, UA: POSITIVE
Nitrite, UA: NEGATIVE
Protein, UA: POSITIVE — AB
Spec Grav, UA: 1.02 (ref 1.010–1.025)
Urobilinogen, UA: 1 E.U./dL
pH, UA: 6 (ref 5.0–8.0)

## 2022-05-24 MED ORDER — CIPROFLOXACIN HCL 500 MG PO TABS: 500.0000 mg | ORAL_TABLET | Freq: Two times a day (BID) | ORAL | 0 refills | Status: AC

## 2022-05-24 NOTE — Progress Notes (Signed)
Subjective:    Patient ID: Matthew Oconnell, male    DOB: [birth year], 67 y.o.   MRN: [id number]  Chief Complaint  Patient presents with   Urinary Tract Infection    Pt in office thinks he may have UTI; same symptoms as last UTI such as urgency, little output, thick yellow globs coming out per patient; pt still seems to be very out of breath; also pt advises recently sent in Cologuard, but showing [facility] recently called pt to schedule Colonoscopy, pt unable to void this morning for urinalysis     HPI Patient is in today for possible UTI. Sx's started this morning. Frequency and urgency, but not much coming out. Last urine output was 3:30 AM (9 hours ago), just dribbles since then. No pain. Had some thick yellow glob discharge that came out a few times. He has yellow sinus congestion. Some sweating earlier today, none now. Some short of breath, but baseline and stable for him. No fever or chills. No lightheadedness. No abd pain. No n/v. No flank pain.   Past Medical History:  Diagnosis Date   Asthmatic bronchitis with acute exacerbation    Cellulitis    Coronary artery calcification    Ganglion cyst    Of Left Hand   Gout    Hypertension    Hyperuricemia    Leg swelling    Mild depression    Mild persistent asthma    Morbid obesity (HCC)    Osteoarthritis    Prediabetes    Pulmonary embolism (HCC)    Recurrent sinusitis    Redness    Bilateral lower extremities   Stasis dermatitis of both legs    Venous insufficiency     Past Surgical History:  Procedure Laterality Date   COLONOSCOPY N/A 05/04/2008   ganglion cyst removed from L hand Left    Right hand repair for a compound break Right     Family History  Problem Relation Age of Onset   Other Father        sepsis   Prostate cancer Brother     Social History   Tobacco Use   Smoking status: Former    Packs/day: 1.00    Years: 20.00    Additional pack years: 0.00    Total pack years: 20.00    Types:  Cigarettes    Quit date: 2015    Years since quitting: 9.2   Smokeless tobacco: Never  Vaping Use   Vaping Use: Never used  Substance Use Topics   Alcohol use: Yes    Comment: occasionally   Drug use: Not Currently     Allergies  Allergen Reactions   Cefuroxime Rash   Sulfamethoxazole-Trimethoprim Rash    Review of Systems NEGATIVE UNLESS OTHERWISE INDICATED IN HPI      Objective:     BP 116/80 (BP Location: Left Arm)   Pulse (!) 108   Temp (!) 97.1 F (36.2 C) (Temporal)   Ht 6\' 1"  (1.854 m)   Wt (!) 350 lb 12.8 oz (159.1 kg)   SpO2 98%   BMI 46.28 kg/m   Wt Readings from Last 3 Encounters:  05/24/22 (!) 350 lb 12.8 oz (159.1 kg)  04/18/22 (!) 347 lb 3.2 oz (157.5 kg)  03/20/22 (!) 357 lb 3.2 oz (162 kg)    BP Readings from Last 3 Encounters:  05/24/22 116/80  05/01/22 128/76  04/18/22 120/76     Physical Exam Vitals and nursing note reviewed.  Constitutional:  General: He is not in acute distress.    Appearance: Normal appearance. He is obese. He is ill-appearing (pale, somewhat diaphoretic, not unusual for patient).  HENT:     Head: Normocephalic.  Eyes:     Extraocular Movements: Extraocular movements intact.     Conjunctiva/sclera: Conjunctivae normal.     Pupils: Pupils are equal, round, and reactive to light.  Cardiovascular:     Rate and Rhythm: Tachycardia present.     Pulses: Normal pulses.     Heart sounds: Normal heart sounds. No murmur heard. Pulmonary:     Effort: Pulmonary effort is normal.     Breath sounds: Normal breath sounds.  Abdominal:     General: Abdomen is flat. Bowel sounds are normal.     Palpations: Abdomen is soft. There is no mass.     Tenderness: There is no abdominal tenderness. There is no right CVA tenderness or left CVA tenderness.  Skin:    Findings: No rash.  Neurological:     General: No focal deficit present.     Mental Status: He is alert and oriented to person, place, and time.  Psychiatric:         Mood and Affect: Mood normal.        Behavior: Behavior normal.        Assessment & Plan:  Urinary frequency -     POCT urinalysis dipstick -     Urine Culture  Other orders -     Ciprofloxacin HCl; Take 1 tablet (500 mg total) by mouth 2 (two) times daily for 5 days.  Dispense: 10 tablet; Refill: 0   UA positive for leuks, protein, ketones, micro blood, - will treat for acute cystitis.  Considered Rocephin in office, but pt has cephalosporin allergy. Start on Cipro 500 mg BID x 5 days. Push fluids. VERY LOW THRESHOLD for ED if patient worsens in any way; pt understanding and agreeable. He will message me later with update.       Return if symptoms worsen or fail to improve.  This note was prepared with assistance of Systems analyst. Occasional wrong-word or sound-a-like substitutions may have occurred due to the inherent limitations of voice recognition software.     Jagar Lua M Madisan Bice, PA-C

## 2022-05-25 LAB — URINE CULTURE
MICRO NUMBER:: 14718019
Result:: NO GROWTH
SPECIMEN QUALITY:: ADEQUATE

## 2022-05-26 ENCOUNTER — Telehealth: Payer: Self-pay | Admitting: Physician Assistant

## 2022-05-26 NOTE — Telephone Encounter (Signed)
Noted and agreed, thank you. 

## 2022-05-26 NOTE — Telephone Encounter (Signed)
Patient states he was unable to reply to PCP message via mychart. States he wanted to let her know he is doing well.

## 2022-06-03 ENCOUNTER — Other Ambulatory Visit: Payer: Self-pay | Admitting: Family Medicine

## 2022-06-03 ENCOUNTER — Other Ambulatory Visit: Payer: Self-pay | Admitting: Physician Assistant

## 2022-06-03 DIAGNOSIS — I10 Essential (primary) hypertension: Secondary | ICD-10-CM

## 2022-06-05 ENCOUNTER — Other Ambulatory Visit: Payer: Federal, State, Local not specified - PPO

## 2022-06-08 ENCOUNTER — Ambulatory Visit: Payer: Federal, State, Local not specified - PPO | Admitting: Cardiology

## 2022-06-17 LAB — COLOGUARD: COLOGUARD: NEGATIVE

## 2022-06-19 ENCOUNTER — Telehealth: Payer: Self-pay | Admitting: Physician Assistant

## 2022-06-19 ENCOUNTER — Other Ambulatory Visit: Payer: Self-pay

## 2022-06-19 ENCOUNTER — Other Ambulatory Visit: Payer: Federal, State, Local not specified - PPO

## 2022-06-19 DIAGNOSIS — I10 Essential (primary) hypertension: Secondary | ICD-10-CM

## 2022-06-19 MED ORDER — LISINOPRIL 10 MG PO TABS: 10.0000 mg | ORAL_TABLET | Freq: Every day | ORAL | 1 refills | Status: DC

## 2022-06-19 NOTE — Telephone Encounter (Signed)
Prescription Request  Previously prescribed by prior PCP office-Patient states he is out of the following medication  06/19/2022  LOV: 05/24/2022  What is the name of the medication or equipment?  lisinopril (ZESTRIL) 10 MG tablet  Have you contacted your pharmacy to request a refill? Yes   Which pharmacy would you like this sent to?  [facility] [address], [place] - [address] [place] [address] Phone: [phone] Fax: [phone]      Patient notified that their request is being sent to the clinical staff for review and that they should receive a response within 2 business days.   Please advise at Mobile [phone] (mobile)

## 2022-06-19 NOTE — Telephone Encounter (Signed)
Rx sent to pharmacy   

## 2022-06-26 ENCOUNTER — Ambulatory Visit: Payer: Federal, State, Local not specified - PPO | Admitting: Cardiology

## 2022-07-01 ENCOUNTER — Other Ambulatory Visit: Payer: Self-pay | Admitting: Physician Assistant

## 2022-07-15 ENCOUNTER — Other Ambulatory Visit: Payer: Self-pay | Admitting: Family Medicine

## 2022-07-15 DIAGNOSIS — J4540 Moderate persistent asthma, uncomplicated: Secondary | ICD-10-CM

## 2022-08-01 ENCOUNTER — Ambulatory Visit: Payer: Medicare Other | Admitting: Physician Assistant

## 2022-08-03 ENCOUNTER — Other Ambulatory Visit: Payer: Self-pay | Admitting: Physician Assistant

## 2022-08-03 ENCOUNTER — Other Ambulatory Visit: Payer: Federal, State, Local not specified - PPO

## 2022-08-07 ENCOUNTER — Other Ambulatory Visit: Payer: Federal, State, Local not specified - PPO

## 2022-08-10 ENCOUNTER — Other Ambulatory Visit: Payer: Federal, State, Local not specified - PPO

## 2022-08-14 ENCOUNTER — Ambulatory Visit: Payer: Federal, State, Local not specified - PPO | Admitting: Cardiology

## 2022-08-16 ENCOUNTER — Ambulatory Visit: Payer: Medicare Other | Admitting: Physician Assistant

## 2022-08-24 ENCOUNTER — Ambulatory Visit: Payer: Medicare Other | Admitting: Physician Assistant

## 2022-08-31 ENCOUNTER — Encounter: Payer: Self-pay | Admitting: Physician Assistant

## 2022-08-31 ENCOUNTER — Ambulatory Visit: Payer: Medicare Other | Admitting: Physician Assistant

## 2022-08-31 VITALS — BP 144/74 | HR 87 | Temp 97.5°F | Ht 73.0 in | Wt 363.8 lb

## 2022-08-31 DIAGNOSIS — E1165 Type 2 diabetes mellitus with hyperglycemia: Secondary | ICD-10-CM

## 2022-08-31 DIAGNOSIS — J4540 Moderate persistent asthma, uncomplicated: Secondary | ICD-10-CM

## 2022-08-31 DIAGNOSIS — I10 Essential (primary) hypertension: Secondary | ICD-10-CM | POA: Diagnosis not present

## 2022-08-31 DIAGNOSIS — D582 Other hemoglobinopathies: Secondary | ICD-10-CM

## 2022-08-31 DIAGNOSIS — F339 Major depressive disorder, recurrent, unspecified: Secondary | ICD-10-CM

## 2022-08-31 LAB — CBC WITH DIFFERENTIAL/PLATELET
Basophils Absolute: 0.1 10*3/uL (ref 0.0–0.1)
Basophils Relative: 0.5 % (ref 0.0–3.0)
Eosinophils Absolute: 0.2 10*3/uL (ref 0.0–0.7)
Eosinophils Relative: 1.5 % (ref 0.0–5.0)
HCT: 48.7 % (ref 39.0–52.0)
Hemoglobin: 16.5 g/dL (ref 13.0–17.0)
Lymphocytes Relative: 17.9 % (ref 12.0–46.0)
Lymphs Abs: 1.9 10*3/uL (ref 0.7–4.0)
MCHC: 34 g/dL (ref 30.0–36.0)
MCV: 96.7 fl (ref 78.0–100.0)
Monocytes Absolute: 0.7 10*3/uL (ref 0.1–1.0)
Monocytes Relative: 7.1 % (ref 3.0–12.0)
Neutro Abs: 7.6 10*3/uL (ref 1.4–7.7)
Neutrophils Relative %: 73 % (ref 43.0–77.0)
Platelets: 273 10*3/uL (ref 150.0–400.0)
RBC: 5.03 Mil/uL (ref 4.22–5.81)
RDW: 13.2 % (ref 11.5–15.5)
WBC: 10.4 10*3/uL (ref 4.0–10.5)

## 2022-08-31 LAB — COMPREHENSIVE METABOLIC PANEL
ALT: 24 U/L (ref 0–53)
AST: 18 U/L (ref 0–37)
Albumin: 4 g/dL (ref 3.5–5.2)
Alkaline Phosphatase: 101 U/L (ref 39–117)
BUN: 10 mg/dL (ref 6–23)
CO2: 28 mEq/L (ref 19–32)
Calcium: 9.5 mg/dL (ref 8.4–10.5)
Chloride: 100 mEq/L (ref 96–112)
Creatinine, Ser: 0.9 mg/dL (ref 0.40–1.50)
GFR: 88.7 mL/min (ref >60.00)
Glucose, Bld: 134 mg/dL — ABNORMAL HIGH (ref 70–99)
Potassium: 4 mEq/L (ref 3.5–5.1)
Sodium: 139 mEq/L (ref 135–145)
Total Bilirubin: 0.8 mg/dL (ref 0.2–1.2)
Total Protein: 6.8 g/dL (ref 6.0–8.3)

## 2022-08-31 LAB — MICROALBUMIN / CREATININE URINE RATIO
Creatinine,U: 60.4 mg/dL
Microalb Creat Ratio: 1.2 mg/g (ref 0.0–30.0)
Microalb, Ur: <0.7 mg/dL (ref 0.0–1.9)

## 2022-08-31 LAB — HEMOGLOBIN A1C: Hgb A1c MFr Bld: 6.1 % (ref 4.6–6.5)

## 2022-08-31 MED ORDER — FLUTICASONE-SALMETEROL 250-50 MCG/ACT IN AEPB: 1.0000 | INHALATION_SPRAY | Freq: Two times a day (BID) | RESPIRATORY_TRACT | 5 refills | Status: DC

## 2022-08-31 NOTE — Assessment & Plan Note (Signed)
Pt reports he is doing better. Started Prozac 20 mg and seeing a noticeable difference. Feeling more motivated overall.

## 2022-08-31 NOTE — Progress Notes (Signed)
Subjective:    Patient ID: Matthew Oconnell, male    DOB: [birth year], 67 y.o.   MRN: [id number]  Chief Complaint  Patient presents with   Follow-up    3 month follow up , patient is fasting for labs , pt is feeling better today , discuss his weight gain , need a refill Flonase     HPI Patient is in today for 3 mo f/up, see A/P for details.   Past Medical History:  Diagnosis Date   Asthmatic bronchitis with acute exacerbation    Cellulitis    Coronary artery calcification    Ganglion cyst    Of Left Hand   Gout    Hypertension    Hyperuricemia    Leg swelling    Mild depression    Mild persistent asthma    Morbid obesity ([facility])    Osteoarthritis    Prediabetes    Pulmonary embolism ([facility]CC)    Recurrent sinusitis    Redness    Bilateral lower extremities   Stasis dermatitis of both legs    Venous insufficiency     Past Surgical History:  Procedure Laterality Date   COLONOSCOPY N/A 05/04/2008   ganglion cyst removed from L hand Left    Right hand repair for a compound break Right     Family History  Problem Relation Age of Onset   Other Father        sepsis   Prostate cancer Brother     Social History   Tobacco Use   Smoking status: Former    Packs/day: 1.00    Years: 20.00    Additional pack years: 0.00    Total pack years: 20.00    Types: Cigarettes    Quit date: 2015    Years since quitting: 9.4   Smokeless tobacco: Never  Vaping Use   Vaping Use: Never used  Substance Use Topics   Alcohol use: Yes    Comment: occasionally   Drug use: Not Currently     Allergies  Allergen Reactions   Cefuroxime Rash   Sulfamethoxazole-Trimethoprim Rash    Review of Systems NEGATIVE UNLESS OTHERWISE INDICATED IN HPI      Objective:     BP (!) 144/74   Pulse 87   Temp (!) 97.5 F (36.4 C)   Ht 6\' 1"  (1.854 m)   Wt (!) 363 lb 12.8 oz (165 kg)   SpO2 95%   BMI 48.00 kg/m   Wt Readings from Last 3 Encounters:  08/31/22 (!) 363 lb 12.8 oz (165  kg)  05/24/22 (!) 350 lb 12.8 oz (159.1 kg)  04/18/22 (!) 347 lb 3.2 oz (157.5 kg)    BP Readings from Last 3 Encounters:  08/31/22 (!) 144/74  05/24/22 116/80  05/01/22 128/76     Physical Exam Vitals and nursing note reviewed.  Constitutional:      Appearance: Normal appearance. He is obese.  Cardiovascular:     Rate and Rhythm: Normal rate and regular rhythm.     Pulses: Normal pulses.  Pulmonary:     Effort: Pulmonary effort is normal.     Breath sounds: Normal breath sounds.  Neurological:     General: No focal deficit present.     Mental Status: He is alert.  Psychiatric:        Mood and Affect: Mood normal.        Assessment & Plan:  Type 2 diabetes mellitus with hyperglycemia, without long-term current use of insulin (  HCC) Assessment & Plan: Lab Results  Component Value Date   HGBA1C 6.4 (A) 03/07/2022   HGBA1C 6.6 (H) 11/17/2021   HGBA1C 6.2 05/17/2021    Stable - will continue lifestyle modifications. No medications. Repeat labs today. Consider Metformin +/- GLP-1.   Orders: -     Comprehensive metabolic panel -     Hemoglobin A1c -     Microalbumin / creatinine urine ratio  Essential hypertension Assessment & Plan: Elevated today. Continue lisinopril 10 mg daily. Monitor at home. Bring log to next appt. If still elevated, need to adjust dosing.   Depression, recurrent (HCC) Assessment & Plan: Pt reports he is doing better. Started Prozac 20 mg and seeing a noticeable difference. Feeling more motivated overall.    Moderate persistent asthma without complication Assessment & Plan: Stable - Allegra, Flonase, Advair, Singulair daily. Refilled Advair today.   Orders: -     Fluticasone-Salmeterol; Inhale 1 puff into the lungs 2 (two) times daily. Rinse mouth after use.  Dispense: 60 each; Refill: 5  Elevated hemoglobin (HCC) -     CBC with Differential/Platelet        Return in about 4 weeks (around 09/28/2022) for blood pressure  check.    Harsha Yusko M Merlene Dante, PA-C

## 2022-08-31 NOTE — Assessment & Plan Note (Signed)
Stable - Allegra, Flonase, Advair, Singulair daily. Refilled Advair today.

## 2022-08-31 NOTE — Assessment & Plan Note (Signed)
Elevated today. Continue lisinopril 10 mg daily. Monitor at home. Bring log to next appt. If still elevated, need to adjust dosing.

## 2022-08-31 NOTE — Assessment & Plan Note (Signed)
Lab Results  Component Value Date   HGBA1C 6.4 (A) 03/07/2022   HGBA1C 6.6 (H) 11/17/2021   HGBA1C 6.2 05/17/2021    Stable - will continue lifestyle modifications. No medications. Repeat labs today. Consider Metformin +/- GLP-1.

## 2022-08-31 NOTE — Patient Instructions (Signed)
Please work on lowering salt intake, increasing water & exercise. Monitor your BP at home, and bring log to next appointment. If still trending >130/80, will adjust BP medication then.   Labs today   Take all medications as directed.  Keep up the great work!!

## 2022-09-04 ENCOUNTER — Other Ambulatory Visit: Payer: Self-pay | Admitting: Physician Assistant

## 2022-09-28 ENCOUNTER — Ambulatory Visit: Payer: Medicare Other | Admitting: Physician Assistant

## 2022-10-04 ENCOUNTER — Encounter (INDEPENDENT_AMBULATORY_CARE_PROVIDER_SITE_OTHER): Payer: Self-pay

## 2022-10-16 ENCOUNTER — Other Ambulatory Visit: Payer: Federal, State, Local not specified - PPO

## 2022-10-17 ENCOUNTER — Telehealth: Payer: Self-pay | Admitting: Physician Assistant

## 2022-10-17 ENCOUNTER — Other Ambulatory Visit: Payer: Self-pay | Admitting: Radiology

## 2022-10-17 DIAGNOSIS — I10 Essential (primary) hypertension: Secondary | ICD-10-CM

## 2022-10-17 MED ORDER — FUROSEMIDE 20 MG PO TABS: 20.0000 mg | ORAL_TABLET | Freq: Two times a day (BID) | ORAL | 1 refills | Status: DC

## 2022-10-17 NOTE — Telephone Encounter (Signed)
Prescription Request  10/17/2022  LOV: 08/31/2022  What is the name of the medication or equipment? furosemide (LASIX) 20 MG tablet   Have you contacted your pharmacy to request a refill? Yes   Which pharmacy would you like this sent to?  [facility] [address] [place] - [address] [place] [address] Phone: [phone] Fax: [phone]    Patient notified that their request is being sent to the clinical staff for review and that they should receive a response within 2 business days.   Please advise at Mobile [phone] (mobile)

## 2022-10-19 ENCOUNTER — Encounter (INDEPENDENT_AMBULATORY_CARE_PROVIDER_SITE_OTHER): Payer: Self-pay

## 2022-10-19 ENCOUNTER — Ambulatory Visit: Payer: Medicare Other | Admitting: Physician Assistant

## 2022-10-23 ENCOUNTER — Other Ambulatory Visit: Payer: Federal, State, Local not specified - PPO

## 2022-11-09 ENCOUNTER — Ambulatory Visit: Payer: Medicare Other | Admitting: Physician Assistant

## 2022-11-13 ENCOUNTER — Other Ambulatory Visit: Payer: Federal, State, Local not specified - PPO

## 2022-11-22 ENCOUNTER — Ambulatory Visit: Payer: Medicare Other | Admitting: Physician Assistant

## 2022-12-05 ENCOUNTER — Ambulatory Visit: Payer: Medicare Other | Admitting: Physician Assistant

## 2022-12-11 ENCOUNTER — Other Ambulatory Visit: Payer: Self-pay | Admitting: Physician Assistant

## 2022-12-11 DIAGNOSIS — I10 Essential (primary) hypertension: Secondary | ICD-10-CM

## 2022-12-15 ENCOUNTER — Ambulatory Visit (INDEPENDENT_AMBULATORY_CARE_PROVIDER_SITE_OTHER): Payer: Medicare Other | Admitting: Physician Assistant

## 2022-12-15 VITALS — BP 134/73 | HR 100 | Temp 97.1°F | Ht 73.0 in | Wt 363.0 lb

## 2022-12-15 DIAGNOSIS — J4540 Moderate persistent asthma, uncomplicated: Secondary | ICD-10-CM | POA: Diagnosis not present

## 2022-12-15 DIAGNOSIS — Z23 Encounter for immunization: Secondary | ICD-10-CM | POA: Diagnosis not present

## 2022-12-15 DIAGNOSIS — F1010 Alcohol abuse, uncomplicated: Secondary | ICD-10-CM

## 2022-12-15 DIAGNOSIS — I712 Thoracic aortic aneurysm, without rupture, unspecified: Secondary | ICD-10-CM | POA: Diagnosis not present

## 2022-12-15 DIAGNOSIS — Z87891 Personal history of nicotine dependence: Secondary | ICD-10-CM

## 2022-12-15 DIAGNOSIS — I10 Essential (primary) hypertension: Secondary | ICD-10-CM | POA: Diagnosis not present

## 2022-12-15 DIAGNOSIS — J0100 Acute maxillary sinusitis, unspecified: Secondary | ICD-10-CM | POA: Diagnosis not present

## 2022-12-15 MED ORDER — AIRSUPRA 90-80 MCG/ACT IN AERO: 2.0000 | INHALATION_SPRAY | Freq: Four times a day (QID) | RESPIRATORY_TRACT | 2 refills | Status: DC | PRN

## 2022-12-15 MED ORDER — METHYLPREDNISOLONE ACETATE 80 MG/ML IJ SUSP
80.0000 mg | Freq: Once | INTRAMUSCULAR | Status: AC
  Administered 2022-12-15: 80 mg via INTRAMUSCULAR

## 2022-12-15 MED ORDER — AMOXICILLIN-POT CLAVULANATE 875-125 MG PO TABS: 1.0000 | ORAL_TABLET | Freq: Two times a day (BID) | ORAL | 0 refills | Status: AC

## 2022-12-15 NOTE — Progress Notes (Signed)
Subjective:    Patient ID: Matthew Oconnell, male    DOB: 29-Nov-1955, 67 y.o.   MRN: [id number]  Chief Complaint  Patient presents with   Medical Management of Chronic Issues    Pt in office for a follow up diabetes; pt has sinus infection but admits to keeping them regularly;     HPI Patient is in today for follow-up.  Discussed the use of AI scribe software for clinical note transcription with the patient, who gave verbal consent to proceed.  History of Present Illness   The patient, with a history of prediabetes, asthma, and aortic aneurysm, presents with a sinus infection. He has been experiencing sinus pressure for about a week. Despite using saline for drainage, the patient has not found significant relief. The patient reports feeling generally good, with an oxygen level of 97.  In addition to the sinus infection, the patient has been dealing with personal issues and admits to drinking too much beer, which he attributes to his weight gain. He expresses a desire to cut back on drinking and get his life together. He has family support, including his mother, a recovered alcoholic, who has been providing guidance.  The patient is due for aortic aneurysm follow-up. He has been managing his prediabetes without medication and reports feeling better than his last visit. He has no known allergies except to cefuroxime and sulfa.       Past Medical History:  Diagnosis Date   Asthmatic bronchitis with acute exacerbation    Cellulitis    Coronary artery calcification    Ganglion cyst    Of Left Hand   Gout    Hypertension    Hyperuricemia    Leg swelling    Mild depression    Mild persistent asthma    Morbid obesity (HCC)    Osteoarthritis    Prediabetes    Pulmonary embolism (HCC)    Recurrent sinusitis    Redness    Bilateral lower extremities   Stasis dermatitis of both legs    Venous insufficiency     Past Surgical History:  Procedure Laterality Date   COLONOSCOPY N/A  05/04/2008   ganglion cyst removed from L hand Left    Right hand repair for a compound break Right     Family History  Problem Relation Age of Onset   Other Father        sepsis   Prostate cancer Brother     Social History   Tobacco Use   Smoking status: Former    Current packs/day: 0.00    Average packs/day: 1 pack/day for 20.0 years (20.0 ttl pk-yrs)    Types: Cigarettes    Start date: 71    Quit date: 2015    Years since quitting: 9.7   Smokeless tobacco: Never  Vaping Use   Vaping status: Never Used  Substance Use Topics   Alcohol use: Yes    Comment: occasionally   Drug use: Not Currently     Allergies  Allergen Reactions   Cefuroxime Rash   Sulfamethoxazole-Trimethoprim Rash    Review of Systems NEGATIVE UNLESS OTHERWISE INDICATED IN HPI      Objective:     BP 134/73 (BP Location: Left Arm)   Pulse 100   Temp (!) 97.1 F (36.2 C) (Temporal)   Ht 6\' 1"  (1.854 m)   Wt (!) 363 lb (164.7 kg)   SpO2 97%   BMI 47.89 kg/m   Wt Readings from Last 3 Encounters:  12/15/22 (!) 363 lb (164.7 kg)  08/31/22 (!) 363 lb 12.8 oz (165 kg)  05/24/22 (!) 350 lb 12.8 oz (159.1 kg)    BP Readings from Last 3 Encounters:  12/15/22 134/73  08/31/22 (!) 144/74  05/24/22 116/80     Physical Exam Vitals and nursing note reviewed.  Constitutional:      Appearance: Normal appearance. He is obese.  HENT:     Nose: Congestion present.  Eyes:     Extraocular Movements: Extraocular movements intact.     Conjunctiva/sclera: Conjunctivae normal.     Pupils: Pupils are equal, round, and reactive to light.  Cardiovascular:     Rate and Rhythm: Normal rate and regular rhythm.     Pulses: Normal pulses.  Pulmonary:     Effort: Pulmonary effort is normal.     Breath sounds: Wheezing (diffuse) present.  Neurological:     General: No focal deficit present.     Mental Status: He is alert.     Gait: Gait normal.  Psychiatric:        Mood and Affect: Mood normal.         Assessment & Plan:  Essential hypertension -     Ambulatory referral to Cardiology  Former smoker  Thoracic aortic aneurysm without rupture, unspecified part (HCC) -     Ambulatory referral to Cardiology -     CT Angio Chest Pulmonary Embolism (PE) W or WO Contrast; Future  Moderate persistent asthma without complication -     Airsupra; Inhale 2 puffs into the lungs every 6 (six) hours as needed.  Dispense: 10.7 g; Refill: 2 -     methylPREDNISolone Acetate  Acute maxillary sinusitis, recurrence not specified -     Amoxicillin-Pot Clavulanate; Take 1 tablet by mouth 2 (two) times daily for 7 days. Take with food.  Dispense: 14 tablet; Refill: 0 -     methylPREDNISolone Acetate  Immunization due -     Flu Vaccine Trivalent High Dose (Fluad)   Assessment and Plan    Sinusitis Persistent sinus pressure for a week, despite saline irrigation. No relief from current management. -Prescribe Augmentin, take with food twice daily. -Administer Depo-Medrol 80mg  injection today to help with sinus congestion and chest wheezing.  Asthma Wheezing and congestion noted on examination, likely secondary to sinusitis and postnasal drip. -Prescribe AirDuo RespiClick (fluticasone propionate and salmeterol) as a rescue inhaler. Use two puffs as needed every 4-6 hours.  Alcohol Use Patient acknowledges excessive beer consumption and expresses desire to reduce intake. Has family support. -Encourage continued efforts towards alcohol cessation.  Aortic Aneurysm History of aortic aneurysm, no current follow-up. -Order CT chest to monitor aneurysm. -Refer to new cardiologist for further management and follow-up.  Prediabetes Last A1C was 6.1 three months ago, patient reports feeling well. -Continue current management and lifestyle modifications.  General Health Maintenance -Administer influenza vaccine today. -Schedule follow-up appointment in 3-4 months with labs.            Return in about 3 months (around 03/17/2023) for recheck/follow-up, fasting labs .   Jaeden Westbay M Macalister Arnaud, PA-C

## 2022-12-29 ENCOUNTER — Inpatient Hospital Stay: Admission: RE | Admit: 2022-12-29 | Payer: Medicare Other | Source: Ambulatory Visit

## 2023-01-10 ENCOUNTER — Other Ambulatory Visit: Payer: Medicare Other

## 2023-01-22 ENCOUNTER — Other Ambulatory Visit: Payer: Medicare Other

## 2023-02-05 ENCOUNTER — Inpatient Hospital Stay: Admission: RE | Admit: 2023-02-05 | Payer: Medicare Other | Source: Ambulatory Visit

## 2023-02-15 ENCOUNTER — Other Ambulatory Visit: Payer: Medicare Other

## 2023-03-02 ENCOUNTER — Ambulatory Visit
Admission: RE | Admit: 2023-03-02 | Discharge: 2023-03-02 | Disposition: A | Discharge status: Home / Self Care | Payer: Medicare Other | Source: Ambulatory Visit | Attending: Physician Assistant | Admitting: Physician Assistant

## 2023-03-02 DIAGNOSIS — I712 Thoracic aortic aneurysm, without rupture, unspecified: Secondary | ICD-10-CM

## 2023-03-02 MED ORDER — IOPAMIDOL (ISOVUE-370) INJECTION 76%
100.0000 mL | Freq: Once | INTRAVENOUS | Status: AC | PRN
  Administered 2023-03-02: 75 mL via INTRAVENOUS

## 2023-03-04 ENCOUNTER — Other Ambulatory Visit: Payer: Self-pay | Admitting: Physician Assistant

## 2023-03-04 DIAGNOSIS — J4540 Moderate persistent asthma, uncomplicated: Secondary | ICD-10-CM

## 2023-03-08 ENCOUNTER — Other Ambulatory Visit: Payer: Self-pay | Admitting: Physician Assistant

## 2023-03-08 DIAGNOSIS — I10 Essential (primary) hypertension: Secondary | ICD-10-CM

## 2023-03-09 ENCOUNTER — Other Ambulatory Visit: Payer: Self-pay | Admitting: Physician Assistant

## 2023-03-09 DIAGNOSIS — I10 Essential (primary) hypertension: Secondary | ICD-10-CM

## 2023-03-13 ENCOUNTER — Ambulatory Visit: Payer: Medicare Other | Admitting: Nurse Practitioner

## 2023-03-20 ENCOUNTER — Ambulatory Visit: Payer: Medicare Other | Admitting: Physician Assistant

## 2023-03-30 ENCOUNTER — Ambulatory Visit: Payer: Medicare Other | Admitting: Physician Assistant

## 2023-04-02 ENCOUNTER — Other Ambulatory Visit: Payer: Self-pay | Admitting: Physician Assistant

## 2023-04-02 DIAGNOSIS — J302 Other seasonal allergic rhinitis: Secondary | ICD-10-CM

## 2023-04-10 ENCOUNTER — Ambulatory Visit: Payer: Medicare Other | Admitting: Physician Assistant

## 2023-04-16 ENCOUNTER — Telehealth: Payer: Self-pay

## 2023-04-16 ENCOUNTER — Other Ambulatory Visit: Payer: Self-pay | Admitting: Physician Assistant

## 2023-04-16 DIAGNOSIS — J4540 Moderate persistent asthma, uncomplicated: Secondary | ICD-10-CM

## 2023-04-16 MED ORDER — AIRSUPRA 90-80 MCG/ACT IN AERO: 2.0000 | INHALATION_SPRAY | Freq: Four times a day (QID) | RESPIRATORY_TRACT | 2 refills | Status: DC | PRN

## 2023-04-16 NOTE — Telephone Encounter (Signed)
 Copied from CRM [id number]. Topic: General - Other >> Apr 16, 2023 10:52 AM Kita Perish H wrote: Reason for CRM: Patient is calling to cancel medication refill request for his Albuterol -Budesonide (AIRSUPRA ) 90-80 MCG/ACT AERO, patient states [facility] reached out to him and is filling his prescription.  Noted

## 2023-04-16 NOTE — Telephone Encounter (Signed)
 Copied from CRM [id number]. Topic: Clinical - Medication Refill >> Apr 16, 2023 10:46 AM Aline Ireland wrote: Most Recent Primary Care Visit:  Provider: ALLWARDT, ALYSSA M  Department: [facility]  Visit Type: OFFICE VISIT  Date: 12/15/2022  Medication: Albuterol -Budesonide (AIRSUPRA ) 90-80 MCG/ACT AERO  Has the patient contacted their pharmacy? Yes (Agent: If no, request that the patient contact the pharmacy for the refill. If patient does not wish to contact the pharmacy document the reason why and proceed with request.) (Agent: If yes, when and what did the pharmacy advise?) Pharmacy informed him that he had no more refills   Is this the correct pharmacy for this prescription? Yes If no, delete pharmacy and type the correct one.  This is the patient's preferred pharmacy:  [facility] [address] [place] - [address] [place] [address] Phone: [phone] Fax: [phone]   Has the prescription been filled recently? Yes  Is the patient out of the medication? No  Has the patient been seen for an appointment in the last year OR does the patient have an upcoming appointment?   Can we respond through MyChart?   Agent: Please be advised that Rx refills may take up to 3 business days. We ask that you follow-up with your pharmacy.

## 2023-04-19 NOTE — Progress Notes (Deleted)
 Cardiology Office Note:  .   Date:  04/19/2023  ID:  Matthew Oconnell, DOB November 29, 1955, MRN [id number] PCP: Matthew Oconnell, Matthew Infante, PA-C  [facility] Providers Cardiologist:  None { Click to update primary MD,subspecialty MD or APP then REFRESH:1}   History of Present Illness: [name] Kitchen   Matthew Oconnell is a 68 y.o. male with history of  Left upper lobe pulmonary embolism (01/2021), coronary artery calcification (non gated CT study), right bundle branch block, hypertension, obesity due to excess calories.  Coronary calcium score 840 mild dilation ascending thoracic aorta 4.1 cm 07/2021.NST ordered but never done and multiple no shows.  ROS: ***  Studies Reviewed: [name] Kitchen         Prior CV Studies: {Select studies to display:26339}  CT PE protocol: 01/27/2021 1. Left upper lobe lobar pulmonary embolus. There is dilatation of  the right ventricle which may be associated with underlying right heart strain. Evaluation of the distal segmental and subsegmental arteries is limited due to suboptimal opacification, mixing artifact, and respiratory motion. 2. Patchy opacities in the lungs bilaterally, which may represent infectious or inflammatory pneumonitis. 3. Cardiomegaly with coronary artery calcifications. There is dilatation of the pulmonary trunk suggesting underlying pulmonary artery hypertension.   CARDIAC DATABASE: EKG: 03/11/2021: NSR, 97 bpm, right bundle branch block.   Echocardiogram: 01/28/2021:  1. Left ventricular ejection fraction, by estimation, is 60 to 65%. The left ventricle has normal function. The left ventricle has no regional wall motion abnormalities. Left ventricular diastolic parameters were normal.   2. Right ventricular systolic function is moderately reduced. The right ventricular size is moderately enlarged.   3. Left atrial size was mildly dilated.   4. The mitral valve is normal in structure. No evidence of mitral valve regurgitation. No evidence of mitral stenosis.    5. The aortic valve is tricuspid. Aortic valve regurgitation is not visualized. No aortic stenosis is present.   6. Aortic dilatation noted. There is mild dilatation of the ascending aorta, measuring 38 mm.   7. The inferior vena cava is dilated in size with >50% respiratory variability, suggesting right atrial pressure of 8 mmHg.  Risk Assessment/Calculations:   {Does this patient have ATRIAL FIBRILLATION?[phone]} No BP recorded.  {Refresh Note OR Click here to enter BP  :1}***       Physical Exam:   VS:  There were no vitals taken for this visit.   Wt Readings from Last 3 Encounters:  12/15/22 (!) 363 lb (164.7 kg)  08/31/22 (!) 363 lb 12.8 oz (165 kg)  05/24/22 (!) 350 lb 12.8 oz (159.1 kg)    GEN: Well nourished, well developed in no acute distress NECK: No JVD; No carotid bruits CARDIAC: ***RRR, no murmurs, rubs, gallops RESPIRATORY:  Clear to auscultation without rales, wheezing or rhonchi  ABDOMEN: Soft, non-tender, non-distended EXTREMITIES:  No edema; No deformity   ASSESSMENT AND PLAN: .    Coronary atherosclerosis due to calcified coronary lesion Noted on non gated CT study in November 2022.  Will order CAC scoring to further evaluate the disease burden.  Start aspirin 81 mg p.o. daily. Check fasting lipid profile, CMP, direct LDL. Based on the results of the lipid profile will determine statin therapy. Echocardiogram results from November 2022 independently reviewed and noted above for further reference. With regards to ischemic work-up patient would benefit from stress test given his risk factors.  However, given his right bundle branch block, recent pulmonary embolism, and being discharged on home O2 would benefit from  pharmacological stress as opposed to exercise stress test. Further recommendations to follow.   Right ventricular dysfunction Had an echocardiogram during his hospitalization for acute pulmonary embolism in November 2022.  He was noted to have  moderately reduced right ventricular systolic function and moderately enlarged right ventricular size. Discordant findings include RV S prime 17.1 cm/s and TAPSE 3.1 cm. His RV dysfunction may be multifactorial given his recent acute pulmonary embolism, hypoxia, undiagnosed sleep apnea, and recent pneumonia. It would be beneficial to reevaluate his right ventricular size and function after he completes treatment for pulmonary embolism, has been evaluated for sleep apnea (has a follow-up appointment in February 2023), and would recommend PFTs given his underlying asthma and questionable COPD given his smoking history. Further recommendations to follow.   Pulmonary embolism left upper lobe, 01/2021 Provoked pulmonary embolism Currently being followed by pulmonary medicine Agree with recommendations with regards to 6 months of anticoagulation and reevaluate.   RBBB Continue to monitor   Benign hypertension Office blood pressures are well controlled. Medications reconciled. Reemphasized the importance of low-salt diet.   Former smoker Educated on the importance of continued smoking cessation.    Class 3 severe obesity due to excess calories with serious comorbidity and body mass index (BMI) of 40.0 to 44.9 in adult [facility]) Body mass index is 42.92 kg/m. I reviewed with the patient the importance of diet, regular physical activity/exercise, weight loss.   Patient is educated on increasing physical activity gradually as tolerated.  With the goal of moderate intensity exercise for 30 minutes a day 5 days a week.     {Are you ordering a CV Procedure (e.g. stress test, cath, DCCV, TEE, etc)?   Press F2        :[id number]}  Dispo: ***  Signed, Matthew Reedy, PA-C

## 2023-04-24 ENCOUNTER — Ambulatory Visit: Payer: Medicare Other | Admitting: Physician Assistant

## 2023-04-25 ENCOUNTER — Encounter: Payer: Self-pay | Admitting: Primary Care

## 2023-05-01 ENCOUNTER — Ambulatory Visit: Payer: Medicare Other | Admitting: Physician Assistant

## 2023-05-08 ENCOUNTER — Ambulatory Visit: Payer: Medicare Other | Admitting: Physician Assistant

## 2023-05-13 ENCOUNTER — Other Ambulatory Visit: Payer: Self-pay | Admitting: Physician Assistant

## 2023-05-13 DIAGNOSIS — J4540 Moderate persistent asthma, uncomplicated: Secondary | ICD-10-CM

## 2023-05-14 NOTE — Progress Notes (Signed)
 Matthew Oconnell is a 68 y.o. male here for a {New prob or follow up:31724}.  History of Present Illness:   No chief complaint on file.   HPI  Sinus infection: Pt complains of *** starting ***.  ***        Past Medical History:  Diagnosis Date   Asthmatic bronchitis with acute exacerbation    Cellulitis    Coronary artery calcification    Ganglion cyst    Of Left Hand   Gout    Hypertension    Hyperuricemia    Leg swelling    Mild depression    Mild persistent asthma    Morbid obesity (HCC)    Osteoarthritis    Prediabetes    Pulmonary embolism (HCC)    Recurrent sinusitis    Redness    Bilateral lower extremities   Stasis dermatitis of both legs    Venous insufficiency      Social History   Tobacco Use   Smoking status: Former    Current packs/day: 0.00    Average packs/day: 1 pack/day for 20.0 years (20.0 ttl pk-yrs)    Types: Cigarettes    Start date: 3    Quit date: 2015    Years since quitting: 10.1   Smokeless tobacco: Never  Vaping Use   Vaping status: Never Used  Substance Use Topics   Alcohol use: Yes    Comment: occasionally   Drug use: Not Currently    Past Surgical History:  Procedure Laterality Date   COLONOSCOPY N/A 05/04/2008   ganglion cyst removed from L hand Left    Right hand repair for a compound break Right     Family History  Problem Relation Age of Onset   Other Father        sepsis   Prostate cancer Brother     Allergies  Allergen Reactions   Cefuroxime Rash   Sulfamethoxazole-Trimethoprim Rash    Current Medications:   Current Outpatient Medications:    albuterol (VENTOLIN HFA) 108 (90 Base) MCG/ACT inhaler, Inhale 2 puffs into the lungs every 6 (six) hours as needed for wheezing or shortness of breath., Disp: 8 g, Rfl: 0   Albuterol-Budesonide (AIRSUPRA) 90-80 MCG/ACT AERO, Inhale 2 puffs into the lungs every 6 (six) hours as needed., Disp: 10.7 g, Rfl: 2   colchicine 0.6 MG tablet, Day 1: Take 2  tablets, then 1 tablet an hour later. Day 2 and beyond: 1 tab daily., Disp: 30 tablet, Rfl: 0   fexofenadine (ALLEGRA) 180 MG tablet, Take 180 mg by mouth daily as needed for allergies or rhinitis., Disp: , Rfl:    FLUoxetine (PROZAC) 20 MG capsule, Take 1 capsule by mouth once daily in the morning, Disp: 30 capsule, Rfl: 0   fluticasone (FLONASE) 50 MCG/ACT nasal spray, USE 1 TO 2 SPRAY(S) IN EACH NOSTRIL ONCE DAILY, Disp: 16 g, Rfl: 0   fluticasone-salmeterol (ADVAIR) 250-50 MCG/ACT AEPB, INHALE 1 DOSE BY MOUTH TWICE DAILY. RINSE MOUTH AFTER USE, Disp: 60 each, Rfl: 0   furosemide (LASIX) 20 MG tablet, Take 1 tablet by mouth twice daily, Disp: 180 tablet, Rfl: 0   lisinopril (ZESTRIL) 10 MG tablet, Take 1 tablet by mouth once daily, Disp: 90 tablet, Rfl: 0   montelukast (SINGULAIR) 10 MG tablet, Take 1 tablet by mouth once daily, Disp: 90 tablet, Rfl: 0   Review of Systems:   Negative unless otherwise specified per HPI.  Vitals:   There were no vitals filed for this  visit.   There is no height or weight on file to calculate BMI.  Physical Exam:   Physical Exam  Assessment and Plan:   There are no diagnoses linked to this encounter.   I, Isabelle Course, acting as a Neurosurgeon for Jarold Motto, [place]., have documented all relevant documentation on the behalf of Jarold Motto, [place], as directed by  Jarold Motto, PA while in the presence of Jarold Motto, [place].  I, Isabelle Course, have reviewed all documentation for this visit. The documentation on 05/14/23 for the exam, diagnosis, procedures, and orders are all accurate and complete.  Jarold Motto, PA-C

## 2023-05-15 ENCOUNTER — Encounter: Payer: Self-pay | Admitting: Physician Assistant

## 2023-05-15 ENCOUNTER — Ambulatory Visit (INDEPENDENT_AMBULATORY_CARE_PROVIDER_SITE_OTHER): Admitting: Physician Assistant

## 2023-05-15 VITALS — BP 150/76 | HR 62 | Temp 98.2°F | Ht 73.0 in | Wt 357.8 lb

## 2023-05-15 DIAGNOSIS — J4540 Moderate persistent asthma, uncomplicated: Secondary | ICD-10-CM

## 2023-05-15 DIAGNOSIS — I10 Essential (primary) hypertension: Secondary | ICD-10-CM | POA: Diagnosis not present

## 2023-05-15 DIAGNOSIS — R0981 Nasal congestion: Secondary | ICD-10-CM | POA: Diagnosis not present

## 2023-05-15 MED ORDER — FLUTICASONE-SALMETEROL 250-50 MCG/ACT IN AEPB: 1.0000 | INHALATION_SPRAY | Freq: Two times a day (BID) | RESPIRATORY_TRACT | 0 refills | Status: DC

## 2023-05-15 MED ORDER — AMOXICILLIN-POT CLAVULANATE 875-125 MG PO TABS: 1.0000 | ORAL_TABLET | Freq: Two times a day (BID) | ORAL | 0 refills | Status: DC

## 2023-05-15 MED ORDER — ALBUTEROL SULFATE HFA 108 (90 BASE) MCG/ACT IN AERS: 2.0000 | INHALATION_SPRAY | Freq: Four times a day (QID) | RESPIRATORY_TRACT | 0 refills | Status: AC | PRN

## 2023-05-15 NOTE — Patient Instructions (Signed)
 It was great to see you!  Start Augmentin  Inhalers have been refilled  Your blood pressure is elevated in our office today.  I recommend that you monitor this at home.  Your goal blood pressure should be around < 130/80, unless you are over 68 years old, your goal may be closer to 140-150/90. Please note if you have been given other goals from a cardiologist or other healthcare provider, please defer to their recommendations.  When preparing to take your blood pressure: Plan ahead. Don't smoke, drink caffeine or exercise within 30 minutes before taking your blood pressure. Empty your bladder. Don't take the measurement over clothes. Remove the clothing over the arm that will be used to measure blood pressure. You can use either arm unless otherwise told by a healthcare provider. Usually there is not a big difference between readings on them. Be still. Allow at least five minutes of quiet rest before measurements. Don't talk or use the phone. Sit correctly. Sit with your back straight and supported (on a dining chair, rather than a sofa). Your feet should be flat on the floor. Do not cross your legs. Support your arm on a flat surface. The middle of the cuff should be placed on the upper arm at heart level.  Measure at the same time of the day. Take multiple readings and record the results. Each time you measure, take two readings one minute apart. Record the results and bring in to your next office visit.  In order to know how well the medication is working, I would like you to take your readings 1-2 hours after taking your blood pressure medication if possible. Take your blood pressure measurements and record 2-3 days per week.  If you get a high blood pressure reading: A single high reading is not an immediate cause for alarm. If you get a reading that is higher than normal, take your blood pressure a second time. Write down the results of both measurements. Check with your health care  professional to see if there's a health concern or whether there may be problems with your monitor. If your blood pressure readings are suddenly higher than 180/120 mm Hg, wait at least one minute and test again. If your readings are still very high, contact your health care professional immediately. You could be having a hypertensive crisis. Call 911 if your blood pressure is higher than 180/120 mm Hg and if you are having new signs or symptoms that may include: Chest pain Shortness of breath Back pain Numbness Weakness Change in vision Difficulty speaking Confusion Dizziness Vomiting    Take care,  Jarold Motto PA-C

## 2023-05-17 ENCOUNTER — Ambulatory Visit: Payer: Self-pay | Admitting: Physician Assistant

## 2023-05-17 ENCOUNTER — Telehealth (INDEPENDENT_AMBULATORY_CARE_PROVIDER_SITE_OTHER): Admitting: Student in an Organized Health Care Education/Training Program

## 2023-05-17 ENCOUNTER — Encounter: Payer: Self-pay | Admitting: Student in an Organized Health Care Education/Training Program

## 2023-05-17 DIAGNOSIS — M109 Gout, unspecified: Secondary | ICD-10-CM | POA: Diagnosis not present

## 2023-05-17 MED ORDER — COLCHICINE 0.6 MG PO TABS: ORAL_TABLET | ORAL | 0 refills | Status: DC

## 2023-05-17 NOTE — Progress Notes (Signed)
   Acute Audio-Video Conference Visit  Subjective:     Patient ID: Matthew Oconnell, male    DOB: 07/16/1955, 68 y.o.   MRN: [id number]  Chief Complaint  Patient presents with   Gout    Gout flare up in left ankle and very sore to touch and hard to walk, redness and swelling. No fevers. Flare up started late yesterday afternoon.  Not on any medications currently for gout. Has been drinking water and cherry juice.     HPI Patient is in today for cute gout pain in his left ankle.  He reports that this has happened before and that he has had numerous gout flares that have been treated with colchicine.  Says this started suddenly 2 days ago.  He is having difficulty walking at this point no fevers or chills.  No systemic symptoms.  Eating and drinking well.  He did have a visit with his primary care physician 2 days ago, started Augmentin for acute sinusitis.     Objective:    Limited exam due to Audio-Video Conference Visit  Pulse 80   SpO2 96%    Physical Exam  Gen: Well-appearing man speaking comfortably in full sentences      Assessment & Plan:   Problem List Items Addressed This Visit       Unprioritized   Gout   Acute recurring issue of acute gout present for the last 2 days in his left ankle.  He has had many gout flares before in the past successfully treated with colchicine.  He has tolerated this medication well without side effects.  Denies any fevers, able to ambulate on the foot.  No systemic symptoms so seems low risk for septic arthritis.  Will plan to treat with colchicine 1.2 mg once, then 0.6 mg twice daily until gout pain resolves.  I talked to him briefly about prophylactic treatment to prevent gout flares with allopurinol in the future.  He is interested in this and can discuss this with his primary care physician once this gout flare resolves.      Relevant Medications   colchicine 0.6 MG tablet    Meds ordered this encounter  Medications   colchicine  0.6 MG tablet    Sig: Day 1: Take 2 tablets, then 1 tablet an hour later. Day 2 and beyond: 1 tab twice daily until gout pain resolves    Dispense:  30 tablet    Refill:  0    No follow-ups on file.  Tyson Alias, MD

## 2023-05-17 NOTE — Telephone Encounter (Signed)
 1st attempt, left voicemail for patient to return call to nurse triage.   Copied From CRM [id number]. Reason for Triage: Patient believes he has Gout and would like for medication to be called in.

## 2023-05-17 NOTE — Assessment & Plan Note (Signed)
 Acute recurring issue of acute gout present for the last 2 days in his left ankle.  He has had many gout flares before in the past successfully treated with colchicine.  He has tolerated this medication well without side effects.  Denies any fevers, able to ambulate on the foot.  No systemic symptoms so seems low risk for septic arthritis.  Will plan to treat with colchicine 1.2 mg once, then 0.6 mg twice daily until gout pain resolves.  I talked to him briefly about prophylactic treatment to prevent gout flares with allopurinol in the future.  He is interested in this and can discuss this with his primary care physician once this gout flare resolves.

## 2023-05-17 NOTE — Telephone Encounter (Signed)
 FYI

## 2023-05-17 NOTE — Telephone Encounter (Signed)
  Chief Complaint: "gout flare up" Symptoms: left ankle redness, swelling and pain Frequency: x 24 hour Pertinent Negatives: Patient denies injury, calf pain, rash, fever Disposition: [] ED /[] Urgent Care (no appt availability in office) / [x] Appointment(In office/virtual)/ []  [facility]/ [] Home Care/ [] Refused Recommended Disposition /[] [facility]/ []  Follow-up with PCP Additional Notes: Patient reports he has been drinking lots of water and cherry juice. He is requesting medication for gout flare up, informed him it could be tomorrow before his PCP is able to address this message. Patient agreeable to acute virtual visit today with available provider.  Copied From CRM [id number]. Reason for Triage: Patient believes he has Gout and would like for medication to be called in.  Reason for Disposition  [1] Redness of the skin AND [2] no fever  Answer Assessment - Initial Assessment Questions 1. ONSET: "When did the pain start?"      Early yesterday afternoon, about 24 hours. Increased thru the night.  2. LOCATION: "Where is the pain located?"      Left ankle.  3. PAIN: "How bad is the pain?"    (Scale 1-10; or mild, moderate, severe)  - MILD (1-3): doesn't interfere with normal activities.   - MODERATE (4-7): interferes with normal activities (e.g., work or school) or awakens from sleep, limping.   - SEVERE (8-10): excruciating pain, unable to do any normal activities, unable to walk.      8/10.  4. WORK OR EXERCISE: "Has there been any recent work or exercise that involved this part of the body?"      Denies.  5. CAUSE: "What do you think is causing the ankle pain?"     He states it is a gout flare up.  6. OTHER SYMPTOMS: "Do you have any other symptoms?" (e.g., calf pain, rash, fever, swelling)     Redness and swelling to left ankle.  7. PREGNANCY: "Is there any chance you are pregnant?" "When was your last menstrual period?"     N/A.  Protocols used: Ankle  Pain-A-AH

## 2023-05-21 ENCOUNTER — Telehealth: Payer: Self-pay | Admitting: Student in an Organized Health Care Education/Training Program

## 2023-05-21 ENCOUNTER — Ambulatory Visit (INDEPENDENT_AMBULATORY_CARE_PROVIDER_SITE_OTHER): Payer: Medicare Other | Admitting: Physician Assistant

## 2023-05-21 ENCOUNTER — Encounter: Payer: Self-pay | Admitting: Physician Assistant

## 2023-05-21 VITALS — BP 134/76 | HR 99 | Temp 97.2°F | Ht 73.0 in | Wt 355.6 lb

## 2023-05-21 DIAGNOSIS — E1165 Type 2 diabetes mellitus with hyperglycemia: Secondary | ICD-10-CM

## 2023-05-21 DIAGNOSIS — D485 Neoplasm of uncertain behavior of skin: Secondary | ICD-10-CM | POA: Diagnosis not present

## 2023-05-21 DIAGNOSIS — M109 Gout, unspecified: Secondary | ICD-10-CM

## 2023-05-21 DIAGNOSIS — Z13220 Encounter for screening for lipoid disorders: Secondary | ICD-10-CM

## 2023-05-21 LAB — MICROALBUMIN / CREATININE URINE RATIO
Creatinine,U: 46.6 mg/dL
Microalb Creat Ratio: UNDETERMINED mg/g (ref 0.0–30.0)
Microalb, Ur: <0.7 mg/dL

## 2023-05-21 LAB — HEMOGLOBIN A1C: Hgb A1c MFr Bld: 6.8 % — ABNORMAL HIGH (ref 4.6–6.5)

## 2023-05-21 LAB — COMPREHENSIVE METABOLIC PANEL
ALT: 40 U/L (ref 0–53)
AST: 25 U/L (ref 0–37)
Albumin: 3.9 g/dL (ref 3.5–5.2)
Alkaline Phosphatase: 87 U/L (ref 39–117)
BUN: 8 mg/dL (ref 6–23)
CO2: 28 meq/L (ref 19–32)
Calcium: 9.9 mg/dL (ref 8.4–10.5)
Chloride: 100 meq/L (ref 96–112)
Creatinine, Ser: 0.97 mg/dL (ref 0.40–1.50)
GFR: 80.67 mL/min (ref >60.00)
Glucose, Bld: 155 mg/dL — ABNORMAL HIGH (ref 70–99)
Potassium: 4.1 meq/L (ref 3.5–5.1)
Sodium: 136 meq/L (ref 135–145)
Total Bilirubin: 0.7 mg/dL (ref 0.2–1.2)
Total Protein: 7.4 g/dL (ref 6.0–8.3)

## 2023-05-21 LAB — LIPID PANEL
Cholesterol: 147 mg/dL (ref 0–200)
HDL: 37.6 mg/dL — ABNORMAL LOW (ref >39.00)
LDL Cholesterol: 90 mg/dL (ref 0–99)
NonHDL: 109.48
Total CHOL/HDL Ratio: 4
Triglycerides: 99 mg/dL (ref 0.0–149.0)
VLDL: 19.8 mg/dL (ref 0.0–40.0)

## 2023-05-21 NOTE — Progress Notes (Signed)
 Patient ID: Matthew Oconnell, male    DOB: 1955-12-25, 68 y.o.   MRN: [id number]   Assessment & Plan:  Type 2 diabetes mellitus with hyperglycemia, without long-term current use of insulin (HCC) -     Hemoglobin A1c -     Microalbumin / creatinine urine ratio -     Comprehensive metabolic panel  Screening for cholesterol level -     Lipid panel  Neoplasm of uncertain behavior of skin -     Ambulatory referral to Dermatology  Acute gout, unspecified cause, unspecified site    Assessment and Plan    Asthma Asthma flare-up with sinus involvement, improved symptoms.  Gout Recent gout flare-up, symptoms improved. Discussed allopurinol for prevention if flare-ups increase. - Advise avoidance of beer and increase water intake.  Type 2 Diabetes Mellitus Type 2 diabetes mellitus, previously well-controlled with lifestyle modifications. No neuropathy or vision changes. - Order labs including A1c and cholesterol. - Perform urine test to check for proteinuria. Lab Results  Component Value Date   HGBA1C 6.1 08/31/2022   HGBA1C 6.4 (A) 03/07/2022   HGBA1C 6.6 (H) 11/17/2021    Basal Cell Carcinoma (suspected) Suspicious L facial cheek spot, possibly basal cell carcinoma. Discussed slow progression and preferable nature if present. - Take picture of the spot and document in chart. - Refer to dermatologist for evaluation and possible biopsy.  General Health Maintenance Engaged in lifestyle modifications, blood pressure well-controlled. - Encourage continuation of current lifestyle modifications. - Schedule vision check-up. - Follow-up in four months to reassess overall health and management of conditions.          Return in about 4 months (around 09/20/2023) for recheck/follow-up.    Subjective:    Chief Complaint  Patient presents with   Medical Management of Chronic Issues    Pt in office for 3 mon f/u and fasting labs; pt had recent gout flare up and telehealth  visit; pt went to [place] back in January and spent a month there; AWV scheduled for this month;     HPI Discussed the use of AI scribe software for clinical note transcription with the patient, who gave verbal consent to proceed.  History of Present Illness   Matthew Oconnell is a 68 year old male with asthma, sinus issues, and gout who presents for follow-up after recent flare-ups.   He has experienced recent flare-ups of asthma and sinus issues following a trip abroad. He describes significant sinus drainage, particularly at night, which has been 'draining down into your chest'. Symptoms have improved compared to last week, but some dripping persists. No specific medications for these conditions were mentioned during this visit.  He experienced a gout flare-up a few days ago, which has since improved. Initially, he was unable to walk on the affected ankle, but now reports only minimal pain. He has had approximately four gout flare-ups over the past ten years, with the last one occurring over a year ago. He is not currently on any gout prevention medication and attributes the recent flare-up to dietary factors, including beer consumption, which he plans to reduce.  Regarding diabetes, he is not currently taking any medication. His A1c was 6.1 eight months ago. He reports making significant lifestyle changes, such as quitting sweets and sodas, drinking black coffee, and increasing water intake. He feels he has been doing well with these changes. No numbness or tingling. He has not updated his vision recently, though he plans to do so. His blood  pressure and weight are reportedly stable, and he intends to continue working on his weight management.  He mentions a spot on his cheek that has been present for three to four years, which he describes as feeling like a bite. He has not sought treatment for this previously.  He recently traveled to [place] to take his wife's ashes back, accompanied by one of  his sons. 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 He expresses a fondness for [place], noting its beauty and cultural richness.       Past Medical History:  Diagnosis Date   Asthmatic bronchitis with acute exacerbation    Cellulitis    Coronary artery calcification    Ganglion cyst    Of Left Hand   Gout    Hypertension    Hyperuricemia    Leg swelling    Mild depression    Mild persistent asthma    Morbid obesity (HCC)    Osteoarthritis    Prediabetes    Pulmonary embolism (HCC)    Recurrent sinusitis    Redness    Bilateral lower extremities   Stasis dermatitis of both legs    Venous insufficiency     Past Surgical History:  Procedure Laterality Date   COLONOSCOPY N/A 05/04/2008   ganglion cyst removed from L hand Left    Right hand repair for a compound break Right     Family History  Problem Relation Age of Onset   Other Father        sepsis   Prostate cancer Brother     Social History   Tobacco Use   Smoking status: Former    Current packs/day: 0.00    Average packs/day: 1 pack/day for 20.0 years (20.0 ttl pk-yrs)    Types: Cigarettes    Start date: 39    Quit date: 2015    Years since quitting: 10.2   Smokeless tobacco: Never  Vaping Use   Vaping status: Never Used  Substance Use Topics   Alcohol use: Yes    Comment: occasionally   Drug use: Not Currently     Allergies  Allergen Reactions   Cefuroxime Rash   Sulfamethoxazole-Trimethoprim Rash    Review of Systems NEGATIVE UNLESS OTHERWISE INDICATED IN HPI      Objective:     BP 134/76 (BP Location: Left Arm, Patient Position: Sitting, Cuff Size: Large)   Pulse 99   Temp (!) 97.2 F (36.2 C) (Temporal)   Ht 6\' 1"  (1.854 m)   Wt (!) 355 lb 9.6 oz (161.3 kg)   SpO2 94%   BMI 46.92 kg/m   Wt Readings from Last 3 Encounters:  05/21/23 (!) 355 lb 9.6 oz (161.3 kg)  05/15/23  (!) 357 lb 12.8 oz (162.3 kg)  12/15/22 (!) 363 lb (164.7 kg)    BP Readings from Last 3 Encounters:  05/21/23 134/76  05/15/23 (!) 150/76  12/15/22 134/73     Physical Exam Vitals and nursing note reviewed.  Constitutional:      Appearance: Normal appearance. He is obese.  HENT:     Nose: Congestion (minimal today) present.  Eyes:     Extraocular Movements: Extraocular movements intact.     Conjunctiva/sclera: Conjunctivae normal.     Pupils: Pupils are equal, round, and reactive to light.  Cardiovascular:     Rate and Rhythm: Normal rate and regular rhythm.  Pulses: Normal pulses.  Pulmonary:     Effort: Pulmonary effort is normal.     Breath sounds: No wheezing.  Skin:    Findings: Lesion (left facial cheek raised pink oval lesion) present.  Neurological:     General: No focal deficit present.     Mental Status: He is alert.     Gait: Gait normal.  Psychiatric:        Mood and Affect: Mood normal.       Raiza Kiesel M Latrice Storlie, PA-C

## 2023-05-21 NOTE — Telephone Encounter (Signed)
 I spoke with the patient by phone to follow-up on our acute care visit from last week for a gout flare in his ankle.  He started colchicine.  Feeling much better.  Able to walk.  Functional again.  No side effects from colchicine.  Planning to take colchicine for another 1 to 2 days.  I recommended he talk with his primary care physician about considering allopurinol for prophylaxis of future gout flares.

## 2023-05-24 ENCOUNTER — Encounter: Payer: Self-pay | Admitting: Physician Assistant

## 2023-05-29 ENCOUNTER — Ambulatory Visit (INDEPENDENT_AMBULATORY_CARE_PROVIDER_SITE_OTHER): Payer: Medicare Other

## 2023-05-29 VITALS — Ht 75.0 in | Wt 355.0 lb

## 2023-05-29 DIAGNOSIS — Z0000 Encounter for general adult medical examination without abnormal findings: Secondary | ICD-10-CM | POA: Diagnosis not present

## 2023-05-29 NOTE — Patient Instructions (Signed)
 Matthew Oconnell , Thank you for taking time to come for your Medicare Wellness Visit. I appreciate your ongoing commitment to your health goals. Please review the following plan we discussed and let me know if I can assist you in the future.   Referrals/Orders/Follow-Ups/Clinician Recommendations: Aim for 30 minutes of exercise or brisk walking, 6-8 glasses of water, and 5 servings of fruits and vegetables each day.   This is a list of the screening recommended for you and due dates:  Health Maintenance  Topic Date Due   Eye exam for diabetics  Never done   Complete foot exam   05/24/2023   Medicare Annual Wellness Visit  06/21/2023*   Hemoglobin A1C  11/21/2023   Screening for Lung Cancer  03/01/2024   Yearly kidney function blood test for diabetes  05/20/2024   Yearly kidney health urinalysis for diabetes  05/20/2024   Cologuard (Stool DNA test)  06/11/2025   DTaP/Tdap/Td vaccine (3 - Td or Tdap) 07/15/2025   Pneumonia Vaccine  Completed   Flu Shot  Completed   HPV Vaccine  Aged Out   COVID-19 Vaccine  Discontinued   Hepatitis C Screening  Discontinued   Zoster (Shingles) Vaccine  Discontinued  *Topic was postponed. The date shown is not the original due date.    Advanced directives: (Declined) Advance directive discussed with you today. Even though you declined this today, please call our office should you change your mind, and we can give you the proper paperwork for you to fill out.  Next Medicare Annual Wellness Visit scheduled for next year: Yes

## 2023-05-29 NOTE — Progress Notes (Signed)
 Subjective:   Matthew Oconnell is a 68 y.o. who presents for a Medicare Wellness preventive visit.  Visit Complete: Virtual I connected with  Matthew Oconnell on 05/29/23 by a audio enabled telemedicine application and verified that I am speaking with the correct person using two identifiers.  Patient Location: Home  Provider Location: Office/Clinic  I discussed the limitations of evaluation and management by telemedicine. The patient expressed understanding and agreed to proceed.  Vital Signs: Because this visit was a virtual/telehealth visit, some criteria may be missing or patient reported. Any vitals not documented were not able to be obtained and vitals that have been documented are patient reported.  VideoDeclined- This patient declined Librarian, academic. Therefore the visit was completed with audio only.  Persons Participating in Visit: Patient.  AWV Questionnaire: Yes: Patient Medicare AWV questionnaire was completed by the patient on 05/25/23; I have confirmed that all information answered by patient is correct and no changes since this date.  Cardiac Risk Factors include: advanced age (>65men, >30 women);diabetes mellitus;dyslipidemia;obesity (BMI >30kg/m2);hypertension;male gender     Objective:    Today's Vitals   05/29/23 1103  Weight: (!) 355 lb (161 kg)  Height: 6\' 3"  (1.905 m)   Body mass index is 44.37 kg/m.     05/29/2023   11:05 AM 05/01/2022    8:04 AM 04/18/2022    3:11 PM 12/07/2021   12:27 PM 01/28/2021   10:12 PM 01/27/2021    6:13 PM  Advanced Directives  Does Patient Have a Medical Advance Directive? No  No No No   Would patient like information on creating a medical advance directive? No - Patient declined No - Patient declined No - Patient declined   No - Patient declined    Current Medications (verified) Outpatient Encounter Medications as of 05/29/2023  Medication Sig   albuterol (VENTOLIN HFA) 108 (90 Base)  MCG/ACT inhaler Inhale 2 puffs into the lungs every 6 (six) hours as needed for wheezing or shortness of breath.   Albuterol-Budesonide (AIRSUPRA) 90-80 MCG/ACT AERO Inhale 2 puffs into the lungs every 6 (six) hours as needed.   amoxicillin-clavulanate (AUGMENTIN) 875-125 MG tablet Take 1 tablet by mouth 2 (two) times daily.   colchicine 0.6 MG tablet Day 1: Take 2 tablets, then 1 tablet an hour later. Day 2 and beyond: 1 tab twice daily until gout pain resolves   fexofenadine (ALLEGRA) 180 MG tablet Take 180 mg by mouth daily as needed for allergies or rhinitis.   FLUoxetine (PROZAC) 20 MG capsule Take 1 capsule by mouth once daily in the morning   fluticasone (FLONASE) 50 MCG/ACT nasal spray USE 1 TO 2 SPRAY(S) IN EACH NOSTRIL ONCE DAILY   fluticasone-salmeterol (ADVAIR) 250-50 MCG/ACT AEPB Inhale 1 puff into the lungs in the morning and at bedtime.   furosemide (LASIX) 20 MG tablet Take 1 tablet by mouth twice daily   lisinopril (ZESTRIL) 10 MG tablet Take 1 tablet by mouth once daily   montelukast (SINGULAIR) 10 MG tablet Take 1 tablet by mouth once daily   No facility-administered encounter medications on file as of 05/29/2023.    Allergies (verified) Cefuroxime and Sulfamethoxazole-trimethoprim   History: Past Medical History:  Diagnosis Date   Asthmatic bronchitis with acute exacerbation    Cellulitis    Coronary artery calcification    Ganglion cyst    Of Left Hand   Gout    Hypertension    Hyperuricemia    Leg swelling  Mild depression    Mild persistent asthma    Morbid obesity (HCC)    Osteoarthritis    Prediabetes    Pulmonary embolism (HCC)    Recurrent sinusitis    Redness    Bilateral lower extremities   Stasis dermatitis of both legs    Venous insufficiency    Past Surgical History:  Procedure Laterality Date   COLONOSCOPY N/A 05/04/2008   ganglion cyst removed from L hand Left    Right hand repair for a compound break Right    Family History  Problem  Relation Age of Onset   Other Father        sepsis   Prostate cancer Brother    Social History   Socioeconomic History   Marital status: Widowed    Spouse name: Not on file   Number of children: 4   Years of education: Not on file   Highest education level: 12th grade  Occupational History   Not on file  Tobacco Use   Smoking status: Former    Current packs/day: 0.00    Average packs/day: 1 pack/day for 20.0 years (20.0 ttl pk-yrs)    Types: Cigarettes    Start date: 44    Quit date: 2015    Years since quitting: 10.2   Smokeless tobacco: Never  Vaping Use   Vaping status: Never Used  Substance and Sexual Activity   Alcohol use: Yes    Comment: occasionally   Drug use: Not Currently   Sexual activity: Not on file  Other Topics Concern   Not on file  Social History Narrative   Not on file   Social Drivers of Health   Financial Resource Strain: Low Risk  (05/29/2023)   Overall Financial Resource Strain (CARDIA)    Difficulty of Paying Living Expenses: Not hard at all  Food Insecurity: No Food Insecurity (05/29/2023)   Hunger Vital Sign    Worried About Running Out of Food in the Last Year: Never true    Ran Out of Food in the Last Year: Never true  Transportation Needs: No Transportation Needs (05/29/2023)   PRAPARE - Administrator, Civil Service (Medical): No    Lack of Transportation (Non-Medical): No  Physical Activity: Insufficiently Active (05/29/2023)   Exercise Vital Sign    Days of Exercise per Week: 1 day    Minutes of Exercise per Session: 30 min  Stress: No Stress Concern Present (05/29/2023)   [employer] of Occupational Health - Occupational Stress Questionnaire    Feeling of Stress : Not at all  Social Connections: Socially Isolated (05/29/2023)   Social Connection and Isolation Panel [NHANES]    Frequency of Communication with Friends and Family: More than three times a week    Frequency of Social Gatherings with Friends and  Family: More than three times a week    Attends Religious Services: Never    Database administrator or Organizations: No    Attends Banker Meetings: Never    Marital Status: Widowed    Tobacco Counseling Counseling given: Not Answered    Clinical Intake:  Pre-visit preparation completed: Yes  Pain : No/denies pain     BMI - recorded: 44.37 Nutritional Status: BMI > 30  Obese Diabetes: Yes CBG done?: No Did pt. bring in CBG monitor from home?: No  Lab Results  Component Value Date   HGBA1C 6.8 (H) 05/21/2023   HGBA1C 6.1 08/31/2022   HGBA1C 6.4 (A) 03/07/2022  How often do you need to have someone help you when you read instructions, pamphlets, or other written materials from your doctor or pharmacy?: 1 - Never  Interpreter Needed?: No  Information entered by :: Lanier Ensign, LPN   Activities of Daily Living     05/25/2023    9:14 AM 04/21/2023   11:00 AM  In your present state of health, do you have any difficulty performing the following activities:  Hearing? 0 0  Vision? 0 0  Difficulty concentrating or making decisions? 0 0  Walking or climbing stairs? 1 1  Dressing or bathing? 0 0  Doing errands, shopping? 0 0  Preparing Food and eating ? N N  Using the Toilet? N N  In the past six months, have you accidently leaked urine? N N  Do you have problems with loss of bowel control? N N  Managing your Medications? N N  Managing your Finances? N N  Housekeeping or managing your Housekeeping? N N    Patient Care Team: Allwardt, Crist Infante, PA-C as PCP - General (Physician Assistant) [facility])  Indicate any recent Medical Services you may have received from other than [facility] providers in the past year (date may be approximate).     Assessment:   This is a routine wellness examination for [facility].  Hearing/Vision screen Hearing Screening - Comments:: Pt denies any hearing issues  Vision  Screening - Comments:: Pt follows up [facility] in [place] for annual ey exams    Goals Addressed             This Visit's Progress    Patient Stated       Lose weight and exercise        Depression Screen     05/29/2023   11:06 AM 05/21/2023   10:21 AM 12/15/2022    1:32 PM 08/31/2022   10:24 AM 05/24/2022   11:41 AM 05/01/2022    9:58 AM 05/01/2022    8:04 AM  PHQ 2/9 Scores  PHQ - 2 Score 0 0 3 0 0 0 0  PHQ- 9 Score 0 2 7  1  0     Fall Risk     05/25/2023    9:14 AM 04/21/2023   11:00 AM 12/15/2022    1:32 PM 08/31/2022   10:24 AM 05/24/2022   11:40 AM  Fall Risk   Falls in the past year? 0 0 0 0 0  Number falls in past yr:   0 0 0  Injury with Fall?   0 0 0  Risk for fall due to : No Fall Risks  No Fall Risks No Fall Risks No Fall Risks  Follow up Falls prevention discussed  Falls evaluation completed Falls prevention discussed;Falls evaluation completed Falls evaluation completed    MEDICARE RISK AT HOME:  Medicare Risk at Home Any stairs in or around the home?: (Patient-Rptd) Yes If so, are there any without handrails?: (Patient-Rptd) No Home free of loose throw rugs in walkways, pet beds, electrical cords, etc?: (Patient-Rptd) Yes Adequate lighting in your home to reduce risk of falls?: (Patient-Rptd) Yes Life alert?: (Patient-Rptd) No Use of a cane, walker or w/c?: (Patient-Rptd) No Grab bars in the bathroom?: (Patient-Rptd) No Shower chair or bench in shower?: (Patient-Rptd) Yes Elevated toilet seat or a handicapped toilet?: (Patient-Rptd) No  TIMED UP AND GO:  Was the test performed?  No  Cognitive Function: 6CIT completed  05/29/2023   11:08 AM 04/18/2022    3:14 PM  6CIT Screen  What Year? 0 points 0 points  What month? 0 points 0 points  What time? 0 points 0 points  Count back from 20 0 points 0 points  Months in reverse 0 points 0 points  Repeat phrase 2 points 0 points  Total Score 2 points 0 points    Immunizations Immunization  History  Administered Date(s) Administered   Fluad Quad(high Dose 65+) 11/17/2021   Fluad Trivalent(High Dose 65+) 12/15/2022   Influenza Split 11/28/2015   PFIZER(Purple Top)SARS-COV-2 Vaccination 06/17/2019   PNEUMOCOCCAL CONJUGATE-20 03/20/2022   Pneumococcal Polysaccharide-23 01/31/2021   Tdap 07/20/2005, 07/16/2015    Screening Tests Health Maintenance  Topic Date Due   OPHTHALMOLOGY EXAM  Never done   FOOT EXAM  05/24/2023   HEMOGLOBIN A1C  11/21/2023   Lung Cancer Screening  03/01/2024   Diabetic kidney evaluation - eGFR measurement  05/20/2024   Diabetic kidney evaluation - Urine ACR  05/20/2024   Medicare Annual Wellness (AWV)  05/28/2024   Fecal DNA (Cologuard)  06/11/2025   DTaP/Tdap/Td (3 - Td or Tdap) 07/15/2025   Pneumonia Vaccine 64+ Years old  Completed   INFLUENZA VACCINE  Completed   HPV VACCINES  Aged Out   COVID-19 Vaccine  Discontinued   Hepatitis C Screening  Discontinued   Zoster Vaccines- Shingrix  Discontinued    Health Maintenance  Health Maintenance Due  Topic Date Due   OPHTHALMOLOGY EXAM  Never done   FOOT EXAM  05/24/2023   Health Maintenance Items Addressed: See Nurse Notes  Additional Screening:  Vision Screening: Recommended annual ophthalmology exams for early detection of glaucoma and other disorders of the eye.  Dental Screening: Recommended annual dental exams for proper oral hygiene  Community Resource Referral / Chronic Care Management: CRR required this visit?  No   CCM required this visit?  No     Plan:     I have personally reviewed and noted the following in the patient's chart:   Medical and social history Use of alcohol, tobacco or illicit drugs  Current medications and supplements including opioid prescriptions. Patient is not currently taking opioid prescriptions. Functional ability and status Nutritional status Physical activity Advanced directives List of other physicians Hospitalizations, surgeries,  and ER visits in previous 12 months Vitals Screenings to include cognitive, depression, and falls Referrals and appointments  In addition, I have reviewed and discussed with patient certain preventive protocols, quality metrics, and best practice recommendations. A written personalized care plan for preventive services as well as general preventive health recommendations were provided to patient.     Marzella Schlein, LPN   1/61/0960   After Visit Summary: (MyChart) Due to this being a telephonic visit, the after visit summary with patients personalized plan was offered to patient via MyChart   Notes: Nothing significant to report at this time.

## 2023-06-01 ENCOUNTER — Ambulatory Visit (INDEPENDENT_AMBULATORY_CARE_PROVIDER_SITE_OTHER): Admitting: Physician Assistant

## 2023-06-01 ENCOUNTER — Other Ambulatory Visit: Payer: Self-pay | Admitting: Physician Assistant

## 2023-06-01 ENCOUNTER — Encounter: Payer: Self-pay | Admitting: Physician Assistant

## 2023-06-01 VITALS — BP 136/70 | HR 102 | Temp 97.0°F | Ht 75.0 in | Wt 352.2 lb

## 2023-06-01 DIAGNOSIS — R0981 Nasal congestion: Secondary | ICD-10-CM

## 2023-06-01 DIAGNOSIS — J4540 Moderate persistent asthma, uncomplicated: Secondary | ICD-10-CM

## 2023-06-01 DIAGNOSIS — I10 Essential (primary) hypertension: Secondary | ICD-10-CM

## 2023-06-01 MED ORDER — PREDNISONE 20 MG PO TABS: 20.0000 mg | ORAL_TABLET | Freq: Two times a day (BID) | ORAL | 0 refills | Status: DC

## 2023-06-01 MED ORDER — DOXYCYCLINE HYCLATE 100 MG PO TABS: 100.0000 mg | ORAL_TABLET | Freq: Two times a day (BID) | ORAL | 0 refills | Status: DC

## 2023-06-01 NOTE — Patient Instructions (Signed)
 It was great to see you!  Start prednisone and doxycycline antibiotic(s)  If no improvement, please reach out to your ENT  Your blood pressure is elevated in our office today.  I recommend that you monitor this at home.  Your goal blood pressure should be around < 130/80, unless you are over 68 years old, your goal may be closer to 140-150/90. Please note if you have been given other goals from a cardiologist or other healthcare provider, please defer to their recommendations.  When preparing to take your blood pressure: Plan ahead. Don't smoke, drink caffeine or exercise within 30 minutes before taking your blood pressure. Empty your bladder. Don't take the measurement over clothes. Remove the clothing over the arm that will be used to measure blood pressure. You can use either arm unless otherwise told by a healthcare provider. Usually there is not a big difference between readings on them. Be still. Allow at least five minutes of quiet rest before measurements. Don't talk or use the phone. Sit correctly. Sit with your back straight and supported (on a dining chair, rather than a sofa). Your feet should be flat on the floor. Do not cross your legs. Support your arm on a flat surface. The middle of the cuff should be placed on the upper arm at heart level.  Measure at the same time of the day. Take multiple readings and record the results. Each time you measure, take two readings one minute apart. Record the results and bring in to your next office visit.  In order to know how well the medication is working, I would like you to take your readings 1-2 hours after taking your blood pressure medication if possible. Take your blood pressure measurements and record 2-3 days per week.  If you get a high blood pressure reading: A single high reading is not an immediate cause for alarm. If you get a reading that is higher than normal, take your blood pressure a second time. Write down the results of  both measurements. Check with your health care professional to see if there's a health concern or whether there may be problems with your monitor. If your blood pressure readings are suddenly higher than 180/120 mm Hg, wait at least one minute and test again. If your readings are still very high, contact your health care professional immediately. You could be having a hypertensive crisis. Call 911 if your blood pressure is higher than 180/120 mm Hg and if you are having new signs or symptoms that may include: Chest pain Shortness of breath Back pain Numbness Weakness Change in vision Difficulty speaking Confusion Dizziness Vomiting   Take care,  Jarold Motto PA-C

## 2023-06-01 NOTE — Progress Notes (Signed)
 Matthew Oconnell is a 68 y.o. male here for a new problem.  History of Present Illness:   Chief Complaint  Patient presents with   Sinus Problem    Pt c/o lingering head and nasal congestion, completed antibiotic Augmentin for 10 days still not feeling better. Denies fever & chills.    Sinus  Patient complains of a lingering head and nasal congestion that has persisted for the past few weeks. He states that his head pressure is worse than his other symptoms of nasal drip and sinus pressure.   Reports finishing a 10 day Augmentin course but symptoms have not resolved yet.  Reports compliance and good tolerance of singular 10 mg and  allegra 180 mg.  Has not taken any other OTC medications.  Denies any sore throat, fever or chills.  Plans to follow up with ENT soon.  Elevated BP  Reports compliance and good tolerance of Lasix 20 mg daily, lisinopril 10 mg daily. Patient denies chest pain, SOB, blurred vision, dizziness, unusual headaches, lower leg swelling. Patient is compliant with medication. Denies excessive caffeine intake, stimulant usage, excessive alcohol intake, or increase in salt. BP Readings from Last 3 Encounters:  06/01/23 (!) 140/80  05/21/23 134/76  05/15/23 (!) 150/76      Past Medical History:  Diagnosis Date   Asthmatic bronchitis with acute exacerbation    Cellulitis    Coronary artery calcification    Ganglion cyst    Of Left Hand   Gout    Hypertension    Hyperuricemia    Leg swelling    Mild depression    Mild persistent asthma    Morbid obesity (HCC)    Osteoarthritis    Prediabetes    Pulmonary embolism (HCC)    Recurrent sinusitis    Redness    Bilateral lower extremities   Stasis dermatitis of both legs    Venous insufficiency      Social History   Tobacco Use   Smoking status: Former    Current packs/day: 0.00    Average packs/day: 1 pack/day for 20.0 years (20.0 ttl pk-yrs)    Types: Cigarettes    Start date: 71    Quit date:  2015    Years since quitting: 10.2   Smokeless tobacco: Never  Vaping Use   Vaping status: Never Used  Substance Use Topics   Alcohol use: Yes    Comment: occasionally   Drug use: Not Currently    Past Surgical History:  Procedure Laterality Date   COLONOSCOPY N/A 05/04/2008   ganglion cyst removed from L hand Left    Right hand repair for a compound break Right     Family History  Problem Relation Age of Onset   Other Father        sepsis   Prostate cancer Brother     Allergies  Allergen Reactions   Cefuroxime Rash   Sulfamethoxazole-Trimethoprim Rash    Current Medications:   Current Outpatient Medications:    albuterol (VENTOLIN HFA) 108 (90 Base) MCG/ACT inhaler, Inhale 2 puffs into the lungs every 6 (six) hours as needed for wheezing or shortness of breath., Disp: 8 g, Rfl: 0   Albuterol-Budesonide (AIRSUPRA) 90-80 MCG/ACT AERO, Inhale 2 puffs into the lungs every 6 (six) hours as needed., Disp: 10.7 g, Rfl: 2   colchicine 0.6 MG tablet, Day 1: Take 2 tablets, then 1 tablet an hour later. Day 2 and beyond: 1 tab twice daily until gout pain resolves, Disp: 30  tablet, Rfl: 0   doxycycline (VIBRA-TABS) 100 MG tablet, Take 1 tablet (100 mg total) by mouth 2 (two) times daily., Disp: 14 tablet, Rfl: 0   fexofenadine (ALLEGRA) 180 MG tablet, Take 180 mg by mouth daily as needed for allergies or rhinitis., Disp: , Rfl:    FLUoxetine (PROZAC) 20 MG capsule, Take 1 capsule by mouth once daily in the morning, Disp: 30 capsule, Rfl: 0   fluticasone (FLONASE) 50 MCG/ACT nasal spray, USE 1 TO 2 SPRAY(S) IN EACH NOSTRIL ONCE DAILY, Disp: 16 g, Rfl: 0   fluticasone-salmeterol (ADVAIR) 250-50 MCG/ACT AEPB, Inhale 1 puff into the lungs in the morning and at bedtime., Disp: 60 each, Rfl: 0   furosemide (LASIX) 20 MG tablet, Take 1 tablet by mouth twice daily, Disp: 180 tablet, Rfl: 0   lisinopril (ZESTRIL) 10 MG tablet, Take 1 tablet by mouth once daily, Disp: 90 tablet, Rfl: 0    montelukast (SINGULAIR) 10 MG tablet, Take 1 tablet by mouth once daily, Disp: 90 tablet, Rfl: 0   predniSONE (DELTASONE) 20 MG tablet, Take 1 tablet (20 mg total) by mouth 2 (two) times daily with a meal., Disp: 10 tablet, Rfl: 0   Review of Systems:   Review of Systems  Constitutional:  Negative for chills and fever.  HENT:  Positive for sinus pain.   Neurological:  Positive for headaches.  Negative unless otherwise specified per HPI.  Vitals:   Vitals:   06/01/23 1023  BP: (!) 140/80  Pulse: (!) 102  Temp: (!) 97 F (36.1 C)  TempSrc: Temporal  SpO2: 96%  Weight: (!) 352 lb 4 oz (159.8 kg)  Height: 6\' 3"  (1.905 m)     Body mass index is 44.03 kg/m.  Physical Exam:   Physical Exam Vitals and nursing note reviewed.  Constitutional:      General: He is not in acute distress.    Appearance: He is well-developed. He is not ill-appearing or toxic-appearing.  HENT:     Head: Normocephalic and atraumatic.     Right Ear: Tympanic membrane, ear canal and external ear normal. Tympanic membrane is not erythematous, retracted or bulging.     Left Ear: Tympanic membrane, ear canal and external ear normal. Tympanic membrane is not erythematous, retracted or bulging.     Nose:     Right Sinus: Frontal sinus tenderness present. No maxillary sinus tenderness.     Left Sinus: Frontal sinus tenderness present. No maxillary sinus tenderness.     Mouth/Throat:     Pharynx: Uvula midline. No posterior oropharyngeal erythema.  Eyes:     General: Lids are normal.     Conjunctiva/sclera: Conjunctivae normal.  Neck:     Trachea: Trachea normal.  Cardiovascular:     Rate and Rhythm: Normal rate and regular rhythm.     Pulses: Normal pulses.     Heart sounds: Normal heart sounds, S1 normal and S2 normal.  Pulmonary:     Effort: Pulmonary effort is normal.     Breath sounds: Normal breath sounds. No decreased breath sounds, wheezing, rhonchi or rales.  Lymphadenopathy:     Cervical: No  cervical adenopathy.  Skin:    General: Skin is warm and dry.  Neurological:     Mental Status: He is alert.     GCS: GCS eye subscore is 4. GCS verbal subscore is 5. GCS motor subscore is 6.  Psychiatric:        Speech: Speech normal.  Behavior: Behavior normal. Behavior is cooperative.     Assessment and Plan:   Sinus congestion No red flags on exam.   Will initiate doxycycline and prednisone per orders.  Discussed taking medications as prescribed.  Reviewed return precautions including new or worsening fever, SOB, new or worsening cough or other concerns.  Push fluids and rest.  I recommend that patient follow-up if symptoms worsen or persist despite treatment x 7-10 days, sooner if needed.   Essential hypertension Above goal today No evidence of end-organ damage on my exam Recommend patient monitor home blood pressure at least a few times weekly Continue Lasix 20 mg daily, lisinopril 10 mg daily If home monitoring shows consistent elevation, or any symptom(s) develop, recommend reach out to [place] for further advice on next steps    Jarold Motto, PA-C  I,Safa M Kadhim,acting as a scribe for Jarold Motto, PA.,have documented all relevant documentation on the behalf of Jarold Motto, PA,as directed by  Jarold Motto, PA while in the presence of Jarold Motto, [place].   I, Jarold Motto, [place], have reviewed all documentation for this visit. The documentation on 06/01/23 for the exam, diagnosis, procedures, and orders are all accurate and complete.

## 2023-06-04 ENCOUNTER — Other Ambulatory Visit: Payer: Self-pay | Admitting: Physician Assistant

## 2023-06-04 DIAGNOSIS — J4540 Moderate persistent asthma, uncomplicated: Secondary | ICD-10-CM

## 2023-06-06 ENCOUNTER — Other Ambulatory Visit: Payer: Self-pay | Admitting: Physician Assistant

## 2023-06-06 DIAGNOSIS — I10 Essential (primary) hypertension: Secondary | ICD-10-CM

## 2023-06-07 NOTE — Progress Notes (Deleted)
 Cardiology Office Note:  .   Date:  06/07/2023  ID:  Matthew Oconnell, DOB 1955/09/23, MRN [id number] PCP: Allwardt, Crist Infante, PA-C  [facility] Providers Cardiologist:  None { Click to update primary MD,subspecialty MD or APP then REFRESH:1}   History of Present Illness: Matthew Oconnell   Matthew Oconnell is a 68 y.o. male  with history of  Left upper lobe pulmonary embolism (01/2021), coronary artery calcification (non gated CT study), right bundle branch block, hypertension, obesity due to excess calories.  Coronary calcium score 840 mild dilation ascending thoracic aorta 4.1 cm 07/2021.NST ordered but never done and multiple no shows.  ROS: ***  Studies Reviewed: Matthew Oconnell         Prior CV Studies: {Select studies to display:26339}  FINDINGS: CORONARY CALCIUM SCORES:   Left Main: 0   LAD: 0   LCx: 0   RCA: 840   Total Agatston Score: 840, motion artifact somewhat limits accuracy   MESA database percentile: 90th   AORTA MEASUREMENTS:   Ascending Aorta: 41 mm   Descending Aorta: 31 mm   OTHER FINDINGS:   Vascular: Normal heart size. No pericardial effusion. Mild aortic valve calcifications. Mild calcifications of the thoracic aorta. Ascending thoracic aorta is mildly dilated.   Mediastinum/Nodes: Esophagus is unremarkable. No enlarged lymph nodes seen in the chest.   Lungs/Pleura: Central airways are patent. No consolidation, pleural effusion or pneumothorax.   Upper Abdomen: No acute abnormality.   Musculoskeletal: No chest wall mass or suspicious bone lesions identified.   IMPRESSION: 1. Total Agatston Score: 840 2. 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 Recommend annual imaging followup by CTA or MRA. This recommendation follows 2010 ACCF/AHA/AATS/ACR/ASA/SCA/SCAI/SIR/STS/SVM Guidelines for the Diagnosis and Management of Patients with Thoracic Aortic Disease. Circulation. 2010; 121: N829-F621. Aortic aneurysm NOS (ICD10-I71.9) 3.  Mild calcifications of the aortic valve which can be seen the setting of aortic sclerosis or stenosis. Recommend echocardiography for further evaluation. 4.  Aortic Atherosclerosis (ICD10-I70.0).     Electronically Signed   By: Allegra Lai M.D.   On: 07/08/2021 19:10CT PE protocol: 01/27/2021 1. Left upper lobe lobar pulmonary embolus. There is dilatation of  the right ventricle which may be associated with underlying right heart strain. Evaluation of the distal segmental and subsegmental arteries is limited due to suboptimal opacification, mixing artifact, and respiratory motion. 2. Patchy opacities in the lungs bilaterally, which may represent infectious or inflammatory pneumonitis. 3. Cardiomegaly with coronary artery calcifications. There is dilatation of the pulmonary trunk suggesting underlying pulmonary artery hypertension.   CARDIAC DATABASE: EKG: 03/11/2021: NSR, 97 bpm, right bundle branch block.   Echocardiogram: 01/28/2021:  1. Left ventricular ejection fraction, by estimation, is 60 to 65%. The left ventricle has normal function. The left ventricle has no regional wall motion abnormalities. Left ventricular diastolic parameters were normal.   2. Right ventricular systolic function is moderately reduced. The right ventricular size is moderately enlarged.   3. Left atrial size was mildly dilated.   4. The mitral valve is normal in structure. No evidence of mitral valve regurgitation. No evidence of mitral stenosis.   5. The aortic valve is tricuspid. Aortic valve regurgitation is not visualized. No aortic stenosis is present.   6. Aortic dilatation noted. There is mild dilatation of the ascending aorta, measuring 38 mm.   7. The inferior vena cava is dilated in size with >50% respiratory variability, suggesting right atrial pressure of 8 mmHg.  Risk Assessment/Calculations:   {  Does this patient have ATRIAL FIBRILLATION?:[id number]} No BP recorded.  {Refresh Note OR Click  here to enter BP  :1}***       Physical Exam:   VS:  There were no vitals taken for this visit.   Wt Readings from Last 3 Encounters:  06/01/23 (!) 352 lb 4 oz (159.8 kg)  05/29/23 (!) 355 lb (161 kg)  05/21/23 (!) 355 lb 9.6 oz (161.3 kg)    GEN: Well nourished, well developed in no acute distress NECK: No JVD; No carotid bruits CARDIAC: ***RRR, no murmurs, rubs, gallops RESPIRATORY:  Clear to auscultation without rales, wheezing or rhonchi  ABDOMEN: Soft, non-tender, non-distended EXTREMITIES:  No edema; No deformity   ASSESSMENT AND PLAN: .   Coronary atherosclerosis due to calcified coronary lesion Noted on non gated CT study in November 2022.  Will order CAC scoring to further evaluate the disease burden.  Start aspirin 81 mg p.o. daily. Check fasting lipid profile, CMP, direct LDL. Based on the results of the lipid profile will determine statin therapy. Echocardiogram results from November 2022 independently reviewed and noted above for further reference. With regards to ischemic work-up patient would benefit from stress test given his risk factors.  However, given his right bundle branch block, recent pulmonary embolism, and being discharged on home O2 would benefit from pharmacological stress as opposed to exercise stress test. Further recommendations to follow.   Right ventricular dysfunction Had an echocardiogram during his hospitalization for acute pulmonary embolism in November 2022.  He was noted to have moderately reduced right ventricular systolic function and moderately enlarged right ventricular size. Discordant findings include RV S prime 17.1 cm/s and TAPSE 3.1 cm. His RV dysfunction may be multifactorial given his recent acute pulmonary embolism, hypoxia, undiagnosed sleep apnea, and recent pneumonia. It would be beneficial to reevaluate his right ventricular size and function after he completes treatment for pulmonary embolism, has been evaluated for sleep apnea  (has a follow-up appointment in February 2023), and would recommend PFTs given his underlying asthma and questionable COPD given his smoking history. Further recommendations to follow.   Pulmonary embolism left upper lobe, 01/2021 Provoked pulmonary embolism Currently being followed by pulmonary medicine Agree with recommendations with regards to 6 months of anticoagulation and reevaluate.   RBBB Continue to monitor   Benign hypertension Office blood pressures are well controlled. Medications reconciled. Reemphasized the importance of low-salt diet.   Former smoker Educated on the importance of continued smoking cessation.    Class 3 severe obesity due to excess calories with serious comorbidity and body mass index (BMI) of 40.0 to 44.9 in adult [facility]) Body mass index is 42.92 kg/m. I reviewed with the patient the importance of diet, regular physical activity/exercise, weight loss.   Patient is educated on increasing physical activity gradually as tolerated.  With the goal of moderate intensity exercise for 30 minutes a day 5 days a week.     {Are you ordering a CV Procedure (e.g. stress test, cath, DCCV, TEE, etc)?   Press F2        :[id number]}  Dispo: ***  Signed, Jacolyn Reedy, PA-C

## 2023-06-08 ENCOUNTER — Ambulatory Visit (INDEPENDENT_AMBULATORY_CARE_PROVIDER_SITE_OTHER)

## 2023-06-13 ENCOUNTER — Other Ambulatory Visit: Payer: Self-pay | Admitting: Physician Assistant

## 2023-06-13 DIAGNOSIS — J302 Other seasonal allergic rhinitis: Secondary | ICD-10-CM

## 2023-06-19 ENCOUNTER — Ambulatory Visit: Payer: Medicare Other | Admitting: Physician Assistant

## 2023-07-05 ENCOUNTER — Other Ambulatory Visit: Payer: Self-pay | Admitting: Physician Assistant

## 2023-07-05 DIAGNOSIS — J4540 Moderate persistent asthma, uncomplicated: Secondary | ICD-10-CM

## 2023-07-26 ENCOUNTER — Ambulatory Visit (INDEPENDENT_AMBULATORY_CARE_PROVIDER_SITE_OTHER): Admitting: Otolaryngology

## 2023-08-02 ENCOUNTER — Ambulatory Visit: Admitting: Dermatology

## 2023-08-02 ENCOUNTER — Ambulatory Visit (INDEPENDENT_AMBULATORY_CARE_PROVIDER_SITE_OTHER): Admitting: Otolaryngology

## 2023-08-02 NOTE — Progress Notes (Deleted)
  Cardiology Office Note:  .   Date:  08/02/2023  ID:  Matthew Oconnell, DOB 07/17/1955, MRN [id number] PCP: Allwardt, Deleta Felix, PA-C  [facility] Providers Cardiologist:  None { Click to update primary MD,subspecialty MD or APP then REFRESH:1}   History of Present Illness: Matthew Oconnell   Matthew Oconnell is a 68 y.o. male whose past medical history and cardiac risk factors include: Left upper lobe pulmonary embolism (01/2021), coronary artery calcification (non gated CT study), right bundle branch block, hypertension, former smoker, obesity due to excess calories.    ROS: ***  Studies Reviewed: Matthew Oconnell         Prior CV Studies: {Select studies to display:26339}  FINDINGS: Coronary CTA 07/2021 CORONARY CALCIUM SCORES:   Left Main: 0   LAD: 0   LCx: 0   RCA: 840   Total Agatston Score: 840, motion artifact somewhat limits accuracy   MESA database percentile: 90th   AORTA MEASUREMENTS:   Ascending Aorta: 41 mm   Descending Aorta: 31 mm   OTHER FINDINGS:   Vascular: Normal heart size. No pericardial effusion. Mild aortic valve calcifications. Mild calcifications of the thoracic aorta. Ascending thoracic aorta is mildly dilated.   Mediastinum/Nodes: Esophagus is unremarkable. No enlarged lymph nodes seen in the chest.   Lungs/Pleura: Central airways are patent. No consolidation, pleural effusion or pneumothorax.   Upper Abdomen: No acute abnormality.   Musculoskeletal: No chest wall mass or suspicious bone lesions identified.   IMPRESSION: 1. Total Agatston Score: 840 2. 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 Recommend annual imaging followup by CTA or MRA. This recommendation follows 2010 ACCF/AHA/AATS/ACR/ASA/SCA/SCAI/SIR/STS/SVM Guidelines for the Diagnosis and Management of Patients with Thoracic Aortic Disease. Circulation. 2010; 121: W098-J191. Aortic aneurysm NOS (ICD10-I71.9) 3. Mild calcifications of the aortic valve which can  be seen the setting of aortic sclerosis or stenosis. Recommend echocardiography for further evaluation. 4.  Aortic Atherosclerosis (ICD10-I70.0).     Electronically Signed   By: Avelino Lek M.D.   On: 07/08/2021 19:10  Risk Assessment/Calculations:   {Does this patient have ATRIAL FIBRILLATION?[phone]} No BP recorded.  {Refresh Note OR Click here to enter BP  :1}***       Physical Exam:   VS:  There were no vitals taken for this visit.   Wt Readings from Last 3 Encounters:  06/01/23 (!) 352 lb 4 oz (159.8 kg)  05/29/23 (!) 355 lb (161 kg)  05/21/23 (!) 355 lb 9.6 oz (161.3 kg)    GEN: Well nourished, well developed in no acute distress NECK: No JVD; No carotid bruits CARDIAC: ***RRR, no murmurs, rubs, gallops RESPIRATORY:  Clear to auscultation without rales, wheezing or rhonchi  ABDOMEN: Soft, non-tender, non-distended EXTREMITIES:  No edema; No deformity   ASSESSMENT AND PLAN: .    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  History of PE with RV dysfunction on echo 2022-f/u echo never done  RBBB  HTN  Obesity     {Are you ordering a CV Procedure (e.g. stress test, cath, DCCV, TEE, etc)?   Press F2        :[id number]}  Dispo: ***  Signed, Theotis Flake, PA-C

## 2023-08-03 ENCOUNTER — Other Ambulatory Visit: Payer: Self-pay | Admitting: Physician Assistant

## 2023-08-03 DIAGNOSIS — J4540 Moderate persistent asthma, uncomplicated: Secondary | ICD-10-CM

## 2023-08-14 ENCOUNTER — Ambulatory Visit: Admitting: Physician Assistant

## 2023-08-15 ENCOUNTER — Ambulatory Visit: Admitting: Dermatology

## 2023-08-22 ENCOUNTER — Ambulatory Visit: Admitting: Dermatology

## 2023-08-25 ENCOUNTER — Other Ambulatory Visit: Payer: Self-pay | Admitting: Physician Assistant

## 2023-08-25 DIAGNOSIS — I10 Essential (primary) hypertension: Secondary | ICD-10-CM

## 2023-08-26 ENCOUNTER — Other Ambulatory Visit: Payer: Self-pay | Admitting: Physician Assistant

## 2023-08-26 DIAGNOSIS — J4540 Moderate persistent asthma, uncomplicated: Secondary | ICD-10-CM

## 2023-08-31 ENCOUNTER — Other Ambulatory Visit: Payer: Self-pay | Admitting: Physician Assistant

## 2023-08-31 DIAGNOSIS — J4540 Moderate persistent asthma, uncomplicated: Secondary | ICD-10-CM

## 2023-09-17 NOTE — Progress Notes (Deleted)
  Cardiology Office Note:  .   Date:  09/17/2023  ID:  Matthew Oconnell, DOB 06/30/1955, MRN [id number] PCP: Matthew Oconnell, Matthew HERO, PA-C  Matthew Oconnell Providers Cardiologist:  None { Click to update primary MD,subspecialty MD or APP then REFRESH:1}   History of Present Illness: Matthew   [name] is a 68 y.o. male with history of  Left upper lobe pulmonary embolism (01/2021), coronary artery calcification (non gated CT study), right bundle branch block, hypertension, obesity due to excess calories.  Coronary calcium score 840 mild dilation ascending thoracic aorta 4.1 cm 07/2021.NST ordered but never done and multiple no shows.  ROS: ***  Studies Reviewed: Matthew         Prior CV Studies: {Select studies to display:26339}  Echocardiogram: 01/28/2021:  1. Left ventricular ejection fraction, by estimation, is 60 to 65%. The left ventricle has normal function. The left ventricle has no regional wall motion abnormalities. Left ventricular diastolic parameters were normal.   2. Right ventricular systolic function is moderately reduced. The right ventricular size is moderately enlarged.   3. Left atrial size was mildly dilated.   4. The mitral valve is normal in structure. No evidence of mitral valve regurgitation. No evidence of mitral stenosis.   5. The aortic valve is tricuspid. Aortic valve regurgitation is not visualized. No aortic stenosis is present.   6. Aortic dilatation noted. There is mild dilatation of the ascending aorta, measuring 38 mm.   7. The inferior vena cava is dilated in size with >50% respiratory variability, suggesting right atrial pressure of 8 mmHg.    Risk Assessment/Calculations:   {Does this patient have ATRIAL FIBRILLATION?:[phone]} No BP recorded.  {Refresh Note OR Click here to enter BP  :1}***       Physical Exam:   VS:  There were no vitals taken for this visit.   Orhtostatics: No data found. Wt Readings from Last 3 Encounters:  06/01/23 (!) 352 lb  4 oz (159.8 kg)  05/29/23 (!) 355 lb (161 kg)  05/21/23 (!) 355 lb 9.6 oz (161.3 kg)    GEN: Well nourished, well developed in no acute distress NECK: No JVD; No carotid bruits CARDIAC: ***RRR, no murmurs, rubs, gallops RESPIRATORY:  Clear to auscultation without rales, wheezing or rhonchi  ABDOMEN: Soft, non-tender, non-distended EXTREMITIES:  No edema; No deformity   ASSESSMENT AND PLAN: .   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  History of PE with RV dysfunction on echo 2022-f/u echo never done  RBBB  HTN  Obesity     {Are you ordering a CV Procedure (e.g. stress test, cath, DCCV, TEE, etc)?   Press F2        :[id number]}  Dispo: ***  Signed, Olivia Pavy, PA-C

## 2023-09-19 ENCOUNTER — Ambulatory Visit (INDEPENDENT_AMBULATORY_CARE_PROVIDER_SITE_OTHER): Admitting: Otolaryngology

## 2023-09-20 ENCOUNTER — Ambulatory Visit: Admitting: Physician Assistant

## 2023-09-24 ENCOUNTER — Ambulatory Visit: Admitting: Dermatology

## 2023-09-27 ENCOUNTER — Other Ambulatory Visit: Payer: Self-pay | Admitting: Physician Assistant

## 2023-09-27 DIAGNOSIS — J4540 Moderate persistent asthma, uncomplicated: Secondary | ICD-10-CM

## 2023-10-01 ENCOUNTER — Ambulatory Visit: Admitting: Physician Assistant

## 2023-10-15 ENCOUNTER — Ambulatory Visit: Admitting: Physician Assistant

## 2023-10-16 ENCOUNTER — Ambulatory Visit: Payer: Self-pay | Admitting: *Deleted

## 2023-10-16 ENCOUNTER — Ambulatory Visit: Admitting: Physician Assistant

## 2023-10-16 ENCOUNTER — Telehealth: Admitting: Physician Assistant

## 2023-10-16 DIAGNOSIS — F439 Reaction to severe stress, unspecified: Secondary | ICD-10-CM

## 2023-10-16 DIAGNOSIS — M109 Gout, unspecified: Secondary | ICD-10-CM

## 2023-10-16 MED ORDER — COLCHICINE 0.6 MG PO TABS: ORAL_TABLET | ORAL | 0 refills | Status: DC

## 2023-10-16 NOTE — Telephone Encounter (Signed)
 I spoke with staff in the office to see if his visit could be changed from an in office to a virtual.   They have changed it.   I called pt and let him know it must be a virtual visit not a phone call.    I spoke with pt and he understands it is a video visit.   I went over with him how to access the appt through his MyChart account.   He agreed to be logged in and ready by 10:15 for his 10:30 appt.

## 2023-10-16 NOTE — Telephone Encounter (Signed)
 Copied from CRM [id number]. Topic: Clinical - Red Word Triage >> Oct 16, 2023  7:41 AM Robinson H wrote: [facility] that prompted transfer to Nurse Triage: Severe case of gout in left foot, burning sensation in foot and swelling, can't stand on it Reason for Disposition  [1] SEVERE pain (e.g., excruciating, unable to do any normal activities) AND [2] not improved after 2 hours of pain medicine  Answer Assessment - Initial Assessment Questions 1. ONSET: When did the pain start?      I'm having a gout flare up in my left foot.   I can't walk on it to come in.   A couple of days ago.    I've this before. 2. LOCATION: Where is the pain located?      Left foot 3. PAIN: How bad is the pain?    (Scale 1-10; or mild, moderate, severe)     It's painful, burning with swelling.    It's in the ankle and into my big toe. 4. WORK OR EXERCISE: Has there been any recent work or exercise that involved this part of the body?      No 5. CAUSE: What do you think is causing the foot pain?     Gout 6. OTHER SYMPTOMS: Do you have any other symptoms? (e.g., leg pain, rash, fever, numbness)     No 7. PREGNANCY: Is there any chance you are pregnant? When was your last menstrual period?     N/A  Protocols used: Foot Pain-A-AH After scheduling appt for in office visit pt wanted to do a virtual visit.  He is not able to walk to get in for an appt.   He is having a gout flare up in his left foot and ankle.   He has had this before and done a video visit and prescribed prednisone .    Is it possible this can be changed to a video visit?   I wasn't able to change it.  Pt was agreeable to someone calling him back to get this changed to a video visit.    Number in chart is correct. Has an in office appt scheduled for this morning at 10:30 with Alyssa Allwardt, PA-C.        FYI Only or Action Required?: Action required by provider: request for appointment.  Patient was last seen in primary care on  06/01/2023 by Job Lukes, PA.  Called Nurse Triage reporting Foot Swelling (gout).  Symptoms began several days ago.2 days ago in ankle and into big toe on left foot.   Having a gout flare up.  He has had this before.   Can't walk to get into the office.   Requesting a video visit after scheduling him for an in office visit now can't change it to a video.     Interventions attempted: OTC medications: ibuprofen.  Symptoms are: gradually worsening.  Triage Disposition: See HCP Within 4 Hours (Or PCP Triage)  Patient/caregiver understands and will follow disposition?: Yes

## 2023-10-16 NOTE — Telephone Encounter (Signed)
 noted

## 2023-10-16 NOTE — Progress Notes (Signed)
   Virtual Visit via Video Note  I connected with  Matthew Oconnell  on 10/16/23 at 10:30 AM EDT by a video enabled telemedicine application and verified that I am speaking with the correct person using two identifiers.  Location: Patient: home Provider: Nature conservation officer at [employer] Persons present: Patient and myself   I discussed the limitations of evaluation and management by telemedicine and the availability of in person appointments. The patient expressed understanding and agreed to proceed.   History of Present Illness:  Discussed the use of AI scribe software for clinical note transcription with the patient, who gave verbal consent to proceed.  History of Present Illness Matthew Oconnell is a 68 year old male with gout who presents with a gout flare-up in the left ankle.  He is experiencing a severe gout flare-up in the left ankle, with pain so intense that he cannot bear weight on it. He has had four to five previous episodes of gout affecting different joints, including the other foot. No recent injury to the ankle is reported.  He attributes the current flare-up to increased beer consumption over the past six to seven months due to stress from significant family problems and frequent travel. His sons have expressed concern about his drinking and support his decision to abstain.  For previous gout flare-ups, he has used colchicine  effectively. Currently, the pain is so severe that it prevents him from attending appointments, as he could not make it to his car due to the pain.  He has been monitoring his oxygen levels and heart rate daily, which remain stable. He has upcoming appointments with a cardiologist and another specialist for a concern previously thought to be cancer.     Observations/Objective:   Gen: Awake, alert, no acute distress Resp: Breathing is even and non-labored Psych: calm/pleasant demeanor Neuro: Alert and Oriented x 3, + facial  symmetry, speech is clear.   Assessment and Plan:  Assessment and Plan Assessment & Plan Acute gout flare of the left ankle Acute gout flare in the left ankle, likely triggered by recent increased alcohol consumption, specifically beer. Reports severe pain, preventing ambulation. No history of injury to the ankle. Previous episodes have been managed with colchicine  effectively. Has decided to abstain from alcohol to prevent future flare-ups. Discussed the potential for starting allopurinol for long-term management after the current flare resolves, as recurrent gout can lead to joint damage. - Prescribe colchicine  for acute gout flare management. - Advise to keep the foot elevated and avoid weight-bearing activities. - Discuss the potential initiation of allopurinol at the next visit for long-term gout management.    Follow Up Instructions:    I discussed the assessment and treatment plan with the patient. The patient was provided an opportunity to ask questions and all were answered. The patient agreed with the plan and demonstrated an understanding of the instructions.   The patient was advised to call back or seek an in-person evaluation if the symptoms worsen or if the condition fails to improve as anticipated.  Aron Inge M Kaid Seeberger, PA-C

## 2023-10-18 ENCOUNTER — Ambulatory Visit: Admitting: Cardiology

## 2023-10-29 ENCOUNTER — Ambulatory Visit: Admitting: Physician Assistant

## 2023-10-29 ENCOUNTER — Encounter: Payer: Self-pay | Admitting: Physician Assistant

## 2023-10-29 VITALS — BP 116/74 | HR 88 | Temp 97.2°F | Ht 75.0 in

## 2023-10-29 DIAGNOSIS — E1165 Type 2 diabetes mellitus with hyperglycemia: Secondary | ICD-10-CM | POA: Diagnosis not present

## 2023-10-29 DIAGNOSIS — R609 Edema, unspecified: Secondary | ICD-10-CM

## 2023-10-29 DIAGNOSIS — F339 Major depressive disorder, recurrent, unspecified: Secondary | ICD-10-CM

## 2023-10-29 DIAGNOSIS — Z8739 Personal history of other diseases of the musculoskeletal system and connective tissue: Secondary | ICD-10-CM

## 2023-10-29 DIAGNOSIS — I10 Essential (primary) hypertension: Secondary | ICD-10-CM

## 2023-10-29 DIAGNOSIS — J4540 Moderate persistent asthma, uncomplicated: Secondary | ICD-10-CM | POA: Diagnosis not present

## 2023-10-29 DIAGNOSIS — F1010 Alcohol abuse, uncomplicated: Secondary | ICD-10-CM | POA: Diagnosis not present

## 2023-10-29 LAB — POCT GLYCOSYLATED HEMOGLOBIN (HGB A1C): Hemoglobin A1C: 7 % — AB (ref 4.0–5.6)
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
## 2023-11-01 ENCOUNTER — Ambulatory Visit: Admitting: Dermatology

## 2023-11-06 ENCOUNTER — Ambulatory Visit: Payer: Self-pay

## 2023-11-06 NOTE — Telephone Encounter (Signed)
 FYI Only or Action Required?: FYI only for provider.  Patient was last seen in primary care on 06/01/2023 by Job Lukes, PA.  Called Nurse Triage reporting Ankle Pain.  Symptoms began a week ago.  Interventions attempted: Prescription medications: colchicine  and OTC medications: ibuprofen.  Symptoms are: gradually improving.  Triage Disposition: See PCP When Office is Open (Within 3 Days)  Patient/caregiver understands and will follow disposition?: Yes                             Copied from CRM #[id number]. Topic: Clinical - Red Word Triage >> Nov 06, 2023  3:55 PM Gennette ORN wrote: Red Word that prompted transfer to Nurse Triage: Patient is still in pain from having gout he said his ankles are in pain. He wants an appointment. Reason for Disposition  [1] MODERATE pain (e.g., interferes with normal activities, limping) AND [2] present > 3 days  Answer Assessment - Initial Assessment Questions 1. ONSET: When did the pain start?      Gout flare-up a month ago, states pain has not resolved  2. LOCATION: Where is the pain located?      Bilateral ankles, left knee  3. PAIN: How bad is the pain?  (Scale 1-10; or mild, moderate, severe)     Rates pain 6-7 5. CAUSE: What do you think is causing the ankle pain?     Gout flare-up 6. OTHER SYMPTOMS: Do you have any other symptoms? (e.g., calf pain, rash, fever, swelling)     Slight swelling of left knee, denies fever, denies swelling and redness in ankles  Protocols used: Ankle Pain-A-AH

## 2023-11-07 ENCOUNTER — Encounter: Payer: Self-pay | Admitting: Family

## 2023-11-07 ENCOUNTER — Ambulatory Visit: Admitting: Physician Assistant

## 2023-11-07 ENCOUNTER — Ambulatory Visit: Admitting: Cardiology

## 2023-11-07 ENCOUNTER — Ambulatory Visit (INDEPENDENT_AMBULATORY_CARE_PROVIDER_SITE_OTHER): Admitting: Family

## 2023-11-07 VITALS — BP 128/78 | HR 95 | Temp 97.5°F | Ht 75.0 in | Wt 335.0 lb

## 2023-11-07 DIAGNOSIS — M10279 Drug-induced gout, unspecified ankle and foot: Secondary | ICD-10-CM | POA: Diagnosis not present

## 2023-11-07 DIAGNOSIS — M109 Gout, unspecified: Secondary | ICD-10-CM | POA: Diagnosis not present

## 2023-11-07 MED ORDER — COLCHICINE 0.6 MG PO TABS: ORAL_TABLET | ORAL | 0 refills | Status: AC

## 2023-11-07 NOTE — Progress Notes (Signed)
 Patient ID: ADEEB KONECNY, male    DOB: 03-10-1955, 68 y.o.   MRN: [id number]  Chief Complaint  Patient presents with   Knee Pain    Pt c/o left knee pain and bilateral ankle pain, Present since 8/10. Has tried colchicine .   Discussed the use of AI scribe software for clinical note transcription with the patient, who gave verbal consent to proceed.  History of Present Illness   DAL BLEW is a 68 year old male who presents with joint pain and swelling.  Polyarticular joint pain and swelling - Severe pain began on October 16, 2023, affecting both ankles, feet, and big toes - Initial pain intensity rated 7-8/10, currently 3/10 - Swelling and erythema have resolved after colchicine  therapy - Persistent joint pain in the big toes and ankles - Burning sensation has subsided  Functional impairment and symptom management - Uses a walker for mobility - Elevates legs to manage symptoms  Dietary and lifestyle modifications - Avoids dietary triggers such as shellfish and alcohol - Stopped alcohol consumption one month ago; previously consumed 9-15 beers daily after wife's death in December 09, 2020 - Consumes [place] yogurt and cherries, believing it may help with uric acid levels - Maintains hydration with water and zero-sugar Gatorade  Medication use - Takes Lasix  twice daily     Assessment & Plan:     Gout flare involving both ankles, feet, and big toes Acute gout flare with initial severe pain and swelling, now improved with colchicine . Pain reduced, swelling and redness resolved. Persistent joint pain possibly due to residual crystallization.  - Sending refill for 7 days of colchicine , advised not recommended for ongoing gout prevention - Continue dietary modifications to avoid gout triggers, including shellfish and alcohol. - Encouraged continued abstinence from alcohol. - Advise continued use of walker and leg elevation.  - Call office if sx are still not improving.    Subjective:     Outpatient Medications Prior to Visit  Medication Sig Dispense Refill   albuterol  (VENTOLIN  HFA) 108 (90 Base) MCG/ACT inhaler Inhale 2 puffs into the lungs every 6 (six) hours as needed for wheezing or shortness of breath. 8 g 0   fexofenadine (ALLEGRA) 180 MG tablet Take 180 mg by mouth daily as needed for allergies or rhinitis.     furosemide  (LASIX ) 20 MG tablet Take 1 tablet by mouth twice daily 180 tablet 0   lisinopril  (ZESTRIL ) 10 MG tablet Take 1 tablet by mouth once daily 90 tablet 2   fluticasone -salmeterol (ADVAIR) 250-50 MCG/ACT AEPB INHALE 1 DOSE BY MOUTH IN THE MORNING AND 1 DOSE AT BEDTIME (Patient not taking: Reported on 11/07/2023) 60 each 0   montelukast  (SINGULAIR ) 10 MG tablet Take 1 tablet by mouth daily (Patient taking differently: Take 10 mg by mouth daily. Daily as needed per patient) 90 tablet 0   No facility-administered medications prior to visit.   Past Medical History:  Diagnosis Date   Asthmatic bronchitis with acute exacerbation    Cellulitis    Coronary artery calcification    Ganglion cyst    Of Left Hand   Gout    Hypertension    Hyperuricemia    Leg swelling    Mild depression    Mild persistent asthma    Morbid obesity (HCC)    Osteoarthritis    Prediabetes    Pulmonary embolism (HCC)    Recurrent sinusitis    Redness    Bilateral lower extremities   Stasis dermatitis of both legs  Venous insufficiency    Past Surgical History:  Procedure Laterality Date   COLONOSCOPY N/A 05/04/2008   ganglion cyst removed from L hand Left    Right hand repair for a compound break Right    Allergies  Allergen Reactions   Cefuroxime Rash   Sulfamethoxazole-Trimethoprim Rash      Objective:    Physical Exam Vitals and nursing note reviewed.  Constitutional:      General: He is not in acute distress.    Appearance: Normal appearance. He is morbidly obese.  HENT:     Head: Normocephalic.  Cardiovascular:     Rate and Rhythm: Normal rate and  regular rhythm.  Pulmonary:     Effort: Pulmonary effort is normal.     Breath sounds: Normal breath sounds.  Musculoskeletal:        General: Normal range of motion.     Cervical back: Normal range of motion.     Left knee: Effusion (mild, no erythema) and bony tenderness present. Normal range of motion.     Right lower leg: 1+ Edema (no erythema noted) present.     Left lower leg: 1+ Edema (no erythema noted) present.  Skin:    General: Skin is warm and dry.  Neurological:     Mental Status: He is alert and oriented to person, place, and time.  Psychiatric:        Mood and Affect: Mood normal.    BP 128/78 (BP Location: Left Arm, Patient Position: Sitting, Cuff Size: Large)   Pulse 95   Temp (!) 97.5 F (36.4 C) (Temporal)   Ht 6' 3 (1.905 m)   Wt (!) 335 lb (152 kg)   SpO2 94%   BMI 41.87 kg/m  Wt Readings from Last 3 Encounters:  11/07/23 (!) 335 lb (152 kg)  06/01/23 (!) 352 lb 4 oz (159.8 kg)  05/29/23 (!) 355 lb (161 kg)      Lucius Krabbe, NP

## 2023-11-07 NOTE — Telephone Encounter (Signed)
 Appt today

## 2023-11-09 ENCOUNTER — Other Ambulatory Visit: Payer: Self-pay | Admitting: Physician Assistant

## 2023-11-09 DIAGNOSIS — J4540 Moderate persistent asthma, uncomplicated: Secondary | ICD-10-CM

## 2023-11-15 ENCOUNTER — Ambulatory Visit: Admitting: Cardiology

## 2023-11-22 ENCOUNTER — Other Ambulatory Visit: Payer: Self-pay | Admitting: Physician Assistant

## 2023-11-22 DIAGNOSIS — I10 Essential (primary) hypertension: Secondary | ICD-10-CM

## 2023-11-22 DIAGNOSIS — J4540 Moderate persistent asthma, uncomplicated: Secondary | ICD-10-CM

## 2023-12-10 ENCOUNTER — Ambulatory Visit: Admitting: Dermatology

## 2023-12-10 ENCOUNTER — Other Ambulatory Visit: Payer: Self-pay | Admitting: Physician Assistant

## 2023-12-10 DIAGNOSIS — J4540 Moderate persistent asthma, uncomplicated: Secondary | ICD-10-CM

## 2024-01-25 ENCOUNTER — Other Ambulatory Visit: Payer: Self-pay | Admitting: Physician Assistant

## 2024-01-25 DIAGNOSIS — J4540 Moderate persistent asthma, uncomplicated: Secondary | ICD-10-CM

## 2024-02-13 ENCOUNTER — Encounter: Payer: Self-pay | Admitting: Dermatology

## 2024-02-13 ENCOUNTER — Ambulatory Visit: Admitting: Dermatology

## 2024-02-13 VITALS — BP 123/71 | HR 77

## 2024-02-13 DIAGNOSIS — D485 Neoplasm of uncertain behavior of skin: Secondary | ICD-10-CM

## 2024-02-13 DIAGNOSIS — D2239 Melanocytic nevi of other parts of face: Secondary | ICD-10-CM | POA: Diagnosis not present

## 2024-02-13 DIAGNOSIS — L738 Other specified follicular disorders: Secondary | ICD-10-CM | POA: Diagnosis not present

## 2024-02-13 DIAGNOSIS — D229 Melanocytic nevi, unspecified: Secondary | ICD-10-CM

## 2024-02-13 NOTE — Progress Notes (Signed)
° °  New Patient Visit   History of Present Illness Matthew Oconnell is a 68 year old male who presents with a spot on his cheek for evaluation. He was referred by a new doctor for evaluation of the spot on his cheek.  He has had a spot on his cheek for approximately ten years, which has not changed in appearance during this time. No history of skin cancer. No changes in the spot over the years.  He also mentions a mole located in his mustache area, which he has had since childhood. He experiences difficulty shaving due to the mole, as he frequently cuts it, causing significant bleeding.  He has a history of [place], which has made him accustomed to needles.  The following portions of the chart were reviewed this encounter and updated as appropriate: medications, allergies, medical history  Review of Systems:  No other skin or systemic complaints except as noted in HPI or Assessment and Plan.  Objective  Well appearing patient in no apparent distress; mood and affect are within normal limits.  A focused examination was performed of the following areas: Left cheek  Relevant exam findings are noted in the Assessment and Plan. Left Malar Cheek 6mm  yellow pink papule with central umbilication   Left Upper Cutaneous Lip Will sched surgery   Assessment & Plan   Sebaceous hyperplasia of cheek Benign dilated oil gland, unchanged for ten years. Removal straightforward under local anesthesia. - Removed sebaceous hyperplasia from cheek under local anesthesia.  Benign melanocytic nevus of upper lip Causes irritation and bleeding during shaving. Excision with stitches planned, pathologist to examine excised tissue. - Scheduled excision of benign melanocytic nevus on upper lip with stitches. NEOPLASM OF UNCERTAIN BEHAVIOR OF SKIN (2) Left Malar Cheek - Skin / nail biopsy Type of biopsy: tangential   Informed consent: discussed and consent obtained   Timeout: patient name, date  of birth, surgical site, and procedure verified   Procedure prep:  Patient was prepped and draped in usual sterile fashion Prep type:  Isopropyl alcohol Anesthesia: the lesion was anesthetized in a standard fashion   Anesthetic:  1% lidocaine w/ epinephrine 1-100,000 buffered w/ 8.4% NaHCO3 Instrument used: DermaBlade   Hemostasis achieved with: aluminum chloride   Outcome: patient tolerated procedure well   Post-procedure details: sterile dressing applied and wound care instructions given   Dressing type: bandage    Specimen 1 - Surgical pathology Differential Diagnosis: R/O sebaceous hyperplasia vs other  Check Margins: No Left Upper Cutaneous Lip NEVUS   SEBACEOUS HYPERPLASIA OF FACE    Return for excision on lef upper lip.  I, Darice Smock, CMA, am acting as scribe for RUFUS CHRISTELLA HOLY, MD.   Documentation: I have reviewed the above documentation for accuracy and completeness, and I agree with the above.  RUFUS CHRISTELLA HOLY, MD

## 2024-02-13 NOTE — Patient Instructions (Signed)

## 2024-02-14 LAB — SURGICAL PATHOLOGY

## 2024-02-15 ENCOUNTER — Ambulatory Visit: Payer: Self-pay | Admitting: Dermatology

## 2024-02-17 ENCOUNTER — Other Ambulatory Visit: Payer: Self-pay | Admitting: Physician Assistant

## 2024-02-17 DIAGNOSIS — J4540 Moderate persistent asthma, uncomplicated: Secondary | ICD-10-CM

## 2024-02-17 DIAGNOSIS — I10 Essential (primary) hypertension: Secondary | ICD-10-CM

## 2024-02-21 ENCOUNTER — Other Ambulatory Visit: Payer: Self-pay | Admitting: Physician Assistant

## 2024-02-21 DIAGNOSIS — I10 Essential (primary) hypertension: Secondary | ICD-10-CM

## 2024-03-03 ENCOUNTER — Ambulatory Visit: Admitting: Physician Assistant

## 2024-03-04 ENCOUNTER — Ambulatory Visit: Admitting: Physician Assistant

## 2024-03-06 ENCOUNTER — Other Ambulatory Visit: Payer: Self-pay | Admitting: Physician Assistant

## 2024-03-06 DIAGNOSIS — J4540 Moderate persistent asthma, uncomplicated: Secondary | ICD-10-CM

## 2024-03-24 ENCOUNTER — Ambulatory Visit: Admitting: Physician Assistant

## 2024-03-27 ENCOUNTER — Ambulatory Visit: Admitting: Physician Assistant

## 2024-04-06 ENCOUNTER — Other Ambulatory Visit: Payer: Self-pay | Admitting: Physician Assistant

## 2024-04-06 DIAGNOSIS — J4540 Moderate persistent asthma, uncomplicated: Secondary | ICD-10-CM

## 2024-04-07 ENCOUNTER — Ambulatory Visit: Admitting: Physician Assistant

## 2024-04-23 ENCOUNTER — Encounter: Admitting: Dermatology

## 2024-04-28 ENCOUNTER — Ambulatory Visit: Admitting: Physician Assistant

## 2024-06-02 ENCOUNTER — Ambulatory Visit
